# Patient Record
Sex: Female | Born: 1950
Health system: Southern US, Community
[De-identification: ages and names within clinical notes are randomized; demographics above are authoritative.]

## PROBLEM LIST (undated history)

## (undated) DIAGNOSIS — K5900 Constipation, unspecified: Secondary | ICD-10-CM

## (undated) DIAGNOSIS — K649 Unspecified hemorrhoids: Secondary | ICD-10-CM

## (undated) DIAGNOSIS — L509 Urticaria, unspecified: Secondary | ICD-10-CM

## (undated) DIAGNOSIS — K219 Gastro-esophageal reflux disease without esophagitis: Secondary | ICD-10-CM

## (undated) DIAGNOSIS — G43909 Migraine, unspecified, not intractable, without status migrainosus: Secondary | ICD-10-CM

## (undated) HISTORY — PX: TONSILLECTOMY: SUR1361

## (undated) HISTORY — PX: ENDOMETRIAL ABLATION: SHX621

## (undated) HISTORY — PX: ADENOIDECTOMY: SUR15

## (undated) HISTORY — DX: Urticaria, unspecified: L50.9

## (undated) HISTORY — PX: BREAST LUMPECTOMY: SHX2

## (undated) HISTORY — PX: STAPEDES SURGERY: SHX789

---

## 1998-08-10 ENCOUNTER — Other Ambulatory Visit: Admission: RE | Admit: 1998-08-10 | Discharge: 1998-08-10 | Payer: Self-pay | Admitting: Obstetrics and Gynecology

## 2000-03-05 ENCOUNTER — Other Ambulatory Visit: Admission: RE | Admit: 2000-03-05 | Discharge: 2000-03-05 | Payer: Self-pay | Admitting: Obstetrics and Gynecology

## 2001-06-13 ENCOUNTER — Other Ambulatory Visit: Admission: RE | Admit: 2001-06-13 | Discharge: 2001-06-13 | Payer: Self-pay | Admitting: Obstetrics and Gynecology

## 2001-08-05 ENCOUNTER — Other Ambulatory Visit: Admission: RE | Admit: 2001-08-05 | Discharge: 2001-08-05 | Payer: Self-pay | Admitting: Obstetrics and Gynecology

## 2001-11-24 ENCOUNTER — Encounter: Payer: Self-pay | Admitting: Internal Medicine

## 2001-11-24 ENCOUNTER — Ambulatory Visit (HOSPITAL_COMMUNITY): Admission: RE | Admit: 2001-11-24 | Discharge: 2001-11-24 | Payer: Self-pay | Admitting: Internal Medicine

## 2001-12-03 ENCOUNTER — Ambulatory Visit (HOSPITAL_COMMUNITY): Admission: RE | Admit: 2001-12-03 | Discharge: 2001-12-03 | Payer: Self-pay | Admitting: Internal Medicine

## 2001-12-03 ENCOUNTER — Encounter: Payer: Self-pay | Admitting: Internal Medicine

## 2001-12-04 ENCOUNTER — Ambulatory Visit (HOSPITAL_COMMUNITY): Admission: RE | Admit: 2001-12-04 | Discharge: 2001-12-04 | Payer: Self-pay | Admitting: Internal Medicine

## 2001-12-04 ENCOUNTER — Encounter: Payer: Self-pay | Admitting: Internal Medicine

## 2001-12-24 ENCOUNTER — Encounter (HOSPITAL_COMMUNITY): Admission: RE | Admit: 2001-12-24 | Discharge: 2002-01-23 | Payer: Self-pay | Admitting: Orthopaedic Surgery

## 2002-01-23 ENCOUNTER — Encounter (HOSPITAL_COMMUNITY): Admission: RE | Admit: 2002-01-23 | Discharge: 2002-02-22 | Payer: Self-pay | Admitting: Orthopaedic Surgery

## 2002-07-28 ENCOUNTER — Other Ambulatory Visit: Admission: RE | Admit: 2002-07-28 | Discharge: 2002-07-28 | Payer: Self-pay | Admitting: Obstetrics and Gynecology

## 2004-06-14 ENCOUNTER — Other Ambulatory Visit: Admission: RE | Admit: 2004-06-14 | Discharge: 2004-06-14 | Payer: Self-pay | Admitting: Obstetrics and Gynecology

## 2005-08-31 ENCOUNTER — Ambulatory Visit (HOSPITAL_COMMUNITY): Admission: RE | Admit: 2005-08-31 | Discharge: 2005-08-31 | Payer: Self-pay | Admitting: *Deleted

## 2005-08-31 ENCOUNTER — Encounter (INDEPENDENT_AMBULATORY_CARE_PROVIDER_SITE_OTHER): Payer: Self-pay | Admitting: Specialist

## 2006-08-01 ENCOUNTER — Ambulatory Visit: Payer: Self-pay | Admitting: Gastroenterology

## 2006-08-29 ENCOUNTER — Ambulatory Visit: Payer: Self-pay | Admitting: Gastroenterology

## 2006-08-29 ENCOUNTER — Ambulatory Visit (HOSPITAL_COMMUNITY): Admission: RE | Admit: 2006-08-29 | Discharge: 2006-08-29 | Payer: Self-pay | Admitting: Gastroenterology

## 2006-11-13 ENCOUNTER — Ambulatory Visit: Payer: Self-pay | Admitting: Gastroenterology

## 2007-08-13 ENCOUNTER — Ambulatory Visit (HOSPITAL_COMMUNITY): Admission: RE | Admit: 2007-08-13 | Discharge: 2007-08-13 | Payer: Self-pay | Admitting: Family Medicine

## 2008-04-16 ENCOUNTER — Ambulatory Visit: Payer: Self-pay | Admitting: Gastroenterology

## 2009-05-16 ENCOUNTER — Encounter: Payer: Self-pay | Admitting: Gastroenterology

## 2009-06-28 ENCOUNTER — Ambulatory Visit (HOSPITAL_COMMUNITY): Admission: RE | Admit: 2009-06-28 | Discharge: 2009-06-28 | Payer: Self-pay | Admitting: Family Medicine

## 2009-11-30 ENCOUNTER — Encounter: Payer: Self-pay | Admitting: Gastroenterology

## 2009-12-01 ENCOUNTER — Encounter: Payer: Self-pay | Admitting: Gastroenterology

## 2010-04-14 ENCOUNTER — Encounter (INDEPENDENT_AMBULATORY_CARE_PROVIDER_SITE_OTHER): Payer: Self-pay | Admitting: *Deleted

## 2010-05-09 NOTE — Medication Information (Signed)
Summary: Tax adviser   Imported By: Rosine Beat 11/30/2009 17:00:43  _____________________________________________________________________  External Attachment:    Type:   Image     Comment:   External Document  Appended Document: RX Folder - amitiza    Prescriptions: AMITIZA 24 MCG CAPS (LUBIPROSTONE) one by mouth two times a day with food  #60 x 5   Entered and Authorized by:   Leanna Battles. Dixon Boos   Signed by:   Leanna Battles Dixon Boos on 12/01/2009   Method used:   Electronically to        [facility] (retail)       [address]       [place], [place]  [address]       Ph: [phone] or [phone]       Fax: [phone]   RxID:   [id number]    NEEDS OV 04/2010  Appended Document: RX Folder reminder in computer

## 2010-05-09 NOTE — Medication Information (Signed)
Summary: Tax adviser   Imported By: Ricard Dillon 05/16/2009 09:13:48  _____________________________________________________________________  External Attachment:    Type:   Image     Comment:   External Document  Appended Document: RX Folder-aciphex    Prescriptions: ACIPHEX 20 MG TBEC (RABEPRAZOLE SODIUM) one by mouth 30 mins before breakfast daily  #30 x 11   Entered and Authorized by:   Leanna Battles. Dixon Boos   Signed by:   Leanna Battles Dixon Boos on 05/16/2009   Method used:   Electronically to        [facility] (retail)       [address]       [place], [place]  [address]       Ph: [phone] or [phone]       Fax: [phone]   RxID:   [id number]

## 2010-05-09 NOTE — Medication Information (Signed)
Summary: AMITIZA  AMITIZA   Imported By: Rexene Alberts 12/01/2009 09:25:33  _____________________________________________________________________  External Attachment:    Type:   Image     Comment:   External Document  Appended Document: AMITIZA duplicate

## 2010-05-11 NOTE — Letter (Signed)
Summary: Recall Office Visit  [facility] [facility]  [address]   [place], [place] [address]   Phone: [phone]  Fax: [phone]      April 14, 2010   [facility] [address] [place], [place]  [address] [birth year]   Dear Ms. Trickett,   According to our records, it is time for you to schedule a follow-up office visit with [place].   At your convenience, please call [phone] to schedule an office visit. If you have any questions, concerns, or feel that this letter is in error, we would appreciate your call.   Sincerely,    Diana Eves  [facility] Ph: [phone]   Fax: [phone]

## 2010-08-22 NOTE — Assessment & Plan Note (Signed)
NAMEALEC, MCPHEE               CHART#:  [id number]   DATE:  11/13/2006                       DOB:  Jun 27, 1950   REERRING PHYSICIAN:  Elfredia Nevins, M.D.   DATE OF VISIT:  November 13, 2006   PROBLEM LIST:  1. Rectal bleeding, secondary to external hemorrhoids.  2. Gastroesophageal reflux disease.  3. Migraines.  4. Endometrial ablation.  5. Lumpectomy in right breast.   SUBJECTIVE:  Ms. Heaps is a 60 year old female, who presents as a  return patient visit.  She is only seeing rectal bleeding once a week.  She reports that sometimes it is a lot and sometimes it is not much.  She does not use the suppositories much.  She rarely has abdominal pain.  She is using the Amitiza twice a day, but has not noticed a dramatic  improvement in her constipation.  She is trying to eat fiber, such as  fruit and bran cereal.  She usually bleeds when she has a hard bowel  movement, but not always.  She has loose stools one to two times a week.   MEDICATIONS:  1. Imitrex as needed.  2. Zyrtec over-the-counter daily.  3. Aciphex daily.  4. Amitiza 24 micrograms twice daily.   OBJECTIVE:  VITAL SIGNS:  Weight 152 pounds, height 5 feet 3 inches,  temperature 97.6, blood pressure 120/82, pulse 72.  GENERAL:  She is in no apparent distress, alert and oriented times  four.LUNGS:  Her lungs are clear to auscultation bilaterally.  CARDIOVASCULAR EXAM:  Regular rhythm, no murmur, normal S1 and  S2.ABDOMEN:  Bowel sounds are present, soft, nontender, nondistended.  No rebound or guarding.EXTREMITIES:  Without cyanosis, clubbing or  edema.   ASSESSMENT:  Ms. Twiggs is a 60 year old female, who has rectal bleeding  secondary to hemorrhoids.  She has constipation, which is likely  secondary to a functional gut disorder.  Her gastroesophageal reflux  disease is well-controlled.   Thank you for allowing me to see Ms. Vandergrift in consultation.  My  recommendations follow.   RECOMMENDATIONS:  1. She  should continue the Amitiza.  She is asked to insure that she      drinks six to eight cups of water or juice daily.  2. She is to achieve 20-30 g of fiber daily.  She was given a high-      fiber-diet hand-out.  3. She has a return patient visit to see me in four months.       Kassie Mends, M.D.  Electronically Signed     SM/MEDQ  D:  11/14/2006  T:  11/15/2006  Job:  [id number]   cc:   Madelin Rear. Sherwood Gambler, MD

## 2010-08-22 NOTE — Assessment & Plan Note (Signed)
[name], [name]               CHART#:  [id number]   DATE:  04/16/2008                       DOB:  21-Sep-1950   REFERRING Navil Kole:  Madelin Rear. Sherwood Gambler, MD   PROBLEM LIST:  1. Intermittent rectal bleeding secondary to external hemorrhoids.  2. Constipation.  3. GERD.  4. Migraines.  5. Endometrial ablation.  6. Right breast lumpectomy.   SUBJECTIVE:  [name] is a 60 year old female who presents as a return  patient visit.  She was last seen in August 2008.  She is doing fairly  well.  Her gastroesophageal reflux disease is well controlled on  Aciphex.  She continues to have intermittent problems with constipation.  She occasionally sees rectal bleeding when she has hard stool.  She has  been drinking water and eating more fruit and wheat.  She has been more  regular since she started drinking a solution for her joints.  Her last  colonoscopy was in 2008.   ALLERGIES:  Codeine.   MEDICATIONS:  1. Zyrtec over-the-counter.  2. Aciphex 20 mg daily.  3. Amitiza 24 mcg b.i.d.  4. Mucinex-D.   OBJECTIVE:  VITAL SIGNS:  Weight 152 pounds, height 5 feet 3 inches,  temperature 97.6, blood pressure 102/60, and pulse 72.  GENERAL:  She is in no apparent distress, alert and oriented x4.LUNGS:  Clear to auscultation bilaterally.CARDIOVASCULAR:  Regular rhythm.  No  murmur.ABDOMEN:  Bowel sounds are present, soft,  nondistended.EXTREMITIES:  No cyanosis or edema.   ASSESSMENT:  [name] is a 60 year old female whose gastroesophageal  reflux disease is well controlled.  She has chronic constipation.  Her  rectal bleeding is secondary to hemorrhoids. Thank you for allowing me  to see [name] in consultation.  My recommendations follow.   RECOMMENDATIONS:  1. Refilled her Amitiza and Aciphex for up to a year.  She is given a      prescription for Anusol rectal suppository and is asked to use them      every 12 hours for 14 days when she has hemorrhoidal flares.  She      was  given 2 refills.  2. She may follow up in 24 months.  Will continue to refill her      Aciphex until her next visit in 24 months.       Kassie Mends, M.D.  Electronically Signed     SM/MEDQ  D:  04/16/2008  T:  04/16/2008  Job:  [id number]   cc:   Madelin Rear. Sherwood Gambler, MD

## 2010-08-25 NOTE — Consult Note (Signed)
NAMEBERRY, GALLACHER              ACCOUNT NO.:  [id number]   MEDICAL RECORD NO.:  [id number]          PATIENT TYPE:  AMB   LOCATION:  DAY                           FACILITY:  [facility]   PHYSICIAN:  Kassie Mends, M.D.      DATE OF BIRTH:  01-Jan-1951   DATE OF CONSULTATION:  08/01/2006  DATE OF DISCHARGE:                                 CONSULTATION   REASON FOR CONSULTATION:  Rectal bleeding and heartburn.   HISTORY OF PRESENT ILLNESS:  [name] is a 60 year old female who  reports having rectal bleeding for many years.  She reports having at  least one bowel movement a day.  She occasionally has abdominal pain.  She is having problems swallowing.  She reports having heartburn all  all the time.  She usually uses Zantac.  She wants to try AcipHex.  She tried her husband's and it worked for her.  She occasionally has  intermittent diarrhea with constipation.  She has a daughter who has IBS  with constipation and a son who has IBS with diarrhea.  She denies any  black stool with tar.  She denies any weight loss or change in her bowel  habits.   PAST MEDICAL HISTORY:  1. Back trouble  2. Migraines.   PAST SURGICAL HISTORY:  1. Tonsillectomy.  2. Lumpectomy in the right breast.  3. Endometrial ablation.   ALLERGIES:  CODEINE makes her nauseated.   MEDICATIONS:  1. Imitrex as needed.  2. Zyrtec over-the-counter.  3. Zantac.   FAMILY HISTORY:  Her mother had polyps over the age of 64.  She has  family history of diabetes, asthma and thyroid disease.   SOCIAL HISTORY:  She is married for 32 years and has two children.  She  is employed.  She drinks less than once every 3 months.  She only drinks  a glass of wine.   REVIEW OF SYSTEMS:  She has never had a colonoscopy.  Review of systems  per the HPI. Otherwise all systems are negative.   PHYSICAL EXAMINATION:  VITAL SIGNS:  Weight 152 pounds, height 5 feet 3  inches, BMI 26.9 (overweight) Temperature 97.5, blood pressure  122/72,  pulse 66.  GENERAL:  She is in no apparent distress, alert and oriented  x4.  HEENT:  Exam is atraumatic, normocephalic.  Pupils equal and react to  light.  Mouth: No oral lesions.  Posterior pharynx without erythema or  exudate.  NECK:  Full range of motion.  No lymphadenopathy.  LUNGS:  Clear to auscultation bilaterally.  CARDIOVASCULAR:  Regular rhythm, no murmur, normal S1 and S2.  ABDOMEN:  Bowel sounds are present. Soft, nontender, nondistended.  No  rebound or guarding, no hepatosplenomegaly, abdominal bruits or  pulsatile masses.  EXTREMITIES:  Without cyanosis, clubbing or edema.  NEURO:  No focal neurologic deficits.   LABORATORY:  [id number] - white count 5.7, hemoglobin 14.1, platelets  297, potassium 4.1, total bili 0.5 and alk phos 48, AST 13, ALT 9,  albumin 4.5, calcium 9.5, triglycerides 127.   ASSESSMENT:  [name] is a 60 year old female with rectal  bleeding  which is likely secondary to hemorrhoids but the differential diagnosis  includes colorectal polyp or malignancy.  She also has gastroesophageal  reflux disease which is not ideally controlled with an H2 blocker.  Thank you for allowing me to see [name] in consultation.  My  recommendations follow.   RECOMMENDATIONS:  1. She is not interested in a liquid bowel prep for colonoscopy.  I      discussed the risks of kidney damage with the osmo prep.  She is      instructed to drink plenty of fluids with the pills and to make      sure her urine stays yellow.  The colonoscopy will be performed for      rectal bleeding and screening.  She will be scheduled on May 22.  2. She is given a prescription for AcipHex as well as the prescription      reimbursement card and reimbursement paper work.  She is also given      the [facility] gastroesophageal reflux disease handout.  On the      day of her colonoscopy we can also discuss whether or not she is      interested in surveillance for Barrett's.  The  likelihood of the      evidence suggests that Barrett's surveillance should usually be      performed on white males over the age of 33.  3. We will obtain records from [facility] from her endometrial      ablation and will review records from [facility] from her      lumpectomy because Ms. Boys has concerns that sedation will cause      her to be extremely nauseated and vomit.  4. She will be scheduled for a follow-up visit with me after endoscopy      is complete.      Kassie Mends, M.D.  Electronically Signed     SM/MEDQ  D:  08/01/2006  T:  08/02/2006  Job:  [id number]   cc:   Madelin Rear. Sherwood Gambler, MD  Fax: [phone]

## 2010-08-25 NOTE — Op Note (Signed)
[name], [name]              ACCOUNT NO.:  [id number]   MEDICAL RECORD NO.:  [id number]          PATIENT TYPE:  AMB   LOCATION:  SDC                           FACILITY:  [facility]   PHYSICIAN:  Miguel Aschoff, M.D.       DATE OF BIRTH:  [birth year]   DATE OF PROCEDURE:  08/31/2005  DATE OF DISCHARGE:                                 OPERATIVE REPORT   PREOPERATIVE DIAGNOSIS:  Irregular vaginal bleeding.   POSTOPERATIVE DIAGNOSIS:  Endometrial polyps.   PROCEDURE:  Cervical dilatation, hysteroscopy, removal of endometrial  polyps, uterine curettage, NovaSure ablation of endometrium.   SURGEON:  Miguel Aschoff, M.D.   ANESTHESIA:  IV sedation with paracervical block.   COMPLICATIONS:  None.   JUSTIFICATION:  The patient is a 60 year old white female with a history of  irregular vaginal bleeding who presents now to undergo evaluation with  hysteroscopy and D&C as well as to try arrest her bleeding using endometrial  ablation.  The risks and benefits of the procedure were discussed with the  patient and informed consent has been obtained.   PROCEDURE:  The patient was taken to the operating and placed in the supine  position and IV sedation was administered without difficulty.  She was then  placed in dorsal lithotomy position and prepped and draped in the usual  sterile fashion.  Examination was carried out which revealed normal external  genitalia, normal Bartholin and Skene's glands, normal urethra.  The vaginal  vault was without gross lesion.  The cervix was without gross lesion.  The  uterus was noted to be globular, top normal size, and retroflexed, no  adnexal masses were noted.  At this point, the uterine cavity was sounded to  11 cm.  A cervical length of 4.5 cm was then determined.  Then, the  endocervical canal was further dilated using serial Pratt dilators until a  #25 Pratt dilator could be passed. At this point, the diagnostic  hysteroscope was advanced through the cervix.   No endocervical lesions were  noted.  Upon entering the endometrial cavity, it was the patient had  multiple endometrial polyps. After desiccating the pathology within the  cavity, the hysteroscope was removed and polyp forceps were introduced and  all the polyps were removed without difficulty and sent for histologic  study.  The hysteroscope was then replaced and again, it was clear the  polyps had been removed. At this point, sharp curettage was carried out of  the endometrial cavity.  Curettage was completed and the specimen was  obtained for histologic study.  The NovaSure endometrial ablation unit was  advanced through the cervix set with a cavity width of 5 cm.  At this point,  the treatment cycle at 179 watts for 69 seconds was carried out without  difficulty.  After completion of the treatment cycle, the unit was removed  intact.  There was hemostasis readily achieved.  It should be noted, a  paracervical block using 18 mL of 1% Xylocaine had been placed prior to the  endometrial ablation with 6 mL being placed at the 12, 4,  and 8 o'clock  positions.  At this point, with the procedure being completed, the patient  was taken out of the lithotomy position and brought to the recovery room in  satisfactory condition.  The estimated blood loss was approximately 20 mL.  The patient tolerated the procedure well.   PLAN:  The patient is to be sent home. Medications for home include Darvocet  N100 one every 4 hours as needed for pain.  She is to resume all her other  outpatient medications.  She is to call for pathology report on the 30th.  She is to call for any problems such as fever, pain or heavy bleeding.      Miguel Aschoff, M.D.  Electronically Signed     AR/MEDQ  D:  08/31/2005  T:  09/01/2005  Job:  [id number]

## 2010-08-25 NOTE — Op Note (Signed)
NAMEAMIA, [name]              ACCOUNT NO.:  [id number]   MEDICAL RECORD NO.:  [id number]          PATIENT TYPE:  AMB   LOCATION:  DAY                           FACILITY:  [facility]   PHYSICIAN:  Kassie Mends, M.D.      DATE OF BIRTH:  [birth year]   DATE OF PROCEDURE:  08/29/2006  DATE OF DISCHARGE:                               OPERATIVE REPORT   PROCEDURE:  Colonoscopy.   INDICATION FOR EXAM:  [name] is a 60 year old female with rectal  bleeding for many years.  She presents for colon cancer screening.  Her  mother had polyps after the age of 10.   FINDINGS:  External hemorrhoids: Otherwise normal colon without evidence  of polyps, masses, inflammatory changes, diverticula, AVMs or internal  hemorrhoids.   RECOMMENDATIONS:  1. High fiber diet:  She is given handout on high-fiber diet and      hemorrhoids.  2. Screening colonoscopy in 10 years since her first degree relative      was diagnosed with polyps after the age of 59.  3. She may use Colace 100 mg once or twice daily to soften stool.  4. I will give her a prescription for Anusol HC one per rectum twice      daily for 14 days if her rectal bleeding occurs.  5. Follow-up appointment in 48-months with Dr. Cira Servant.   MEDICATIONS:  1. Demerol 75 mg IV.  2. Versed 5 mg IV.   PROCEDURE TECHNIQUE:  Physical exam was performed.  Informed consent was  obtained from the patient after the explaining benefits, risks and  alternatives of the procedure.  The patient was connected to the monitor  and placed in the left lateral position.  Continuous oxygen was provided  by nasal cannula and IV medicine administered through an indwelling  cannula.  After administration of sedation and rectal exam, the  patient's rectum was intubated  and the scope was advanced under direct visualization to the cecum.  The  scope was subsequently removed slowly by carefully examining the color,  texture, anatomy and integrity of the mucosa on the way  out.  The  patient was recovered in endoscopy and discharged home in satisfactory  condition.      Kassie Mends, M.D.  Electronically Signed     SM/MEDQ  D:  08/29/2006  T:  08/29/2006  Job:  [id number]   cc:   Madelin Rear. Sherwood Gambler, MD  Fax: [phone]

## 2011-02-12 ENCOUNTER — Other Ambulatory Visit (HOSPITAL_COMMUNITY): Payer: Self-pay | Admitting: Family Medicine

## 2011-02-12 ENCOUNTER — Ambulatory Visit (HOSPITAL_COMMUNITY)
Admission: RE | Admit: 2011-02-12 | Discharge: 2011-02-12 | Disposition: A | Discharge status: Home / Self Care | Payer: 59 | Source: Ambulatory Visit | Attending: Family Medicine | Admitting: Family Medicine

## 2011-02-12 DIAGNOSIS — M25579 Pain in unspecified ankle and joints of unspecified foot: Secondary | ICD-10-CM | POA: Insufficient documentation

## 2013-01-23 ENCOUNTER — Encounter (HOSPITAL_COMMUNITY): Payer: Self-pay | Admitting: Emergency Medicine

## 2013-01-23 ENCOUNTER — Emergency Department (HOSPITAL_COMMUNITY): Payer: 59

## 2013-01-23 ENCOUNTER — Emergency Department (HOSPITAL_COMMUNITY)
Admission: EM | Admit: 2013-01-23 | Discharge: 2013-01-23 | Disposition: A | Discharge status: Home / Self Care | Payer: 59 | Attending: Emergency Medicine | Admitting: Emergency Medicine

## 2013-01-23 DIAGNOSIS — Z8679 Personal history of other diseases of the circulatory system: Secondary | ICD-10-CM | POA: Insufficient documentation

## 2013-01-23 DIAGNOSIS — K219 Gastro-esophageal reflux disease without esophagitis: Secondary | ICD-10-CM | POA: Insufficient documentation

## 2013-01-23 DIAGNOSIS — Y9389 Activity, other specified: Secondary | ICD-10-CM | POA: Insufficient documentation

## 2013-01-23 DIAGNOSIS — W010XXA Fall on same level from slipping, tripping and stumbling without subsequent striking against object, initial encounter: Secondary | ICD-10-CM | POA: Insufficient documentation

## 2013-01-23 DIAGNOSIS — S5291XA Unspecified fracture of right forearm, initial encounter for closed fracture: Secondary | ICD-10-CM

## 2013-01-23 DIAGNOSIS — S52599A Other fractures of lower end of unspecified radius, initial encounter for closed fracture: Secondary | ICD-10-CM | POA: Insufficient documentation

## 2013-01-23 DIAGNOSIS — Y929 Unspecified place or not applicable: Secondary | ICD-10-CM | POA: Insufficient documentation

## 2013-01-23 DIAGNOSIS — Z79899 Other long term (current) drug therapy: Secondary | ICD-10-CM | POA: Insufficient documentation

## 2013-01-23 HISTORY — DX: Constipation, unspecified: K59.00

## 2013-01-23 HISTORY — DX: Gastro-esophageal reflux disease without esophagitis: K21.9

## 2013-01-23 HISTORY — DX: Unspecified hemorrhoids: K64.9

## 2013-01-23 HISTORY — DX: Migraine, unspecified, not intractable, without status migrainosus: G43.909

## 2013-01-23 NOTE — ED Notes (Addendum)
Pain rt wrist, fell onto outstretched arm.[name] radial pulse  Ice pack to wrist.

## 2013-01-23 NOTE — ED Provider Notes (Signed)
  Medical screening examination/treatment/procedure(s) were performed by non-physician practitioner and as supervising physician I was immediately available for consultation/collaboration.   Joshuan Bolander, MD 01/23/13 2224 

## 2013-01-23 NOTE — ED Provider Notes (Signed)
CSN: [id number]     Arrival date & time 01/23/13  1937 History   First MD Initiated Contact with Patient 01/23/13 2010     Chief Complaint  Patient presents with  . Wrist Pain   (Consider location/radiation/quality/duration/timing/severity/associated sxs/prior Treatment) HPI Comments: [name] is a 62 y.o. Right handed female presenting with right wrist pain after tripping and falling on her outstretched right hand prior to arrival.  She was standing along the edge of a local creek, and landed in soft soil.  Her pain is constant and worse with palpation and attempts at movement.  She is able to bend her fingers without difficulty, denies numbness in her hand and has no pain in her elbow or shoulder with movement.  Pain in her wrist does radiate to his proximal forearm.  She has taken no medicines prior to arrival. She denies other injury including no head, neck or back injury or pain.     The history is provided by the patient.    Past Medical History  Diagnosis Date  . GERD (gastroesophageal reflux disease)   . Migraine headache   . Hemorrhoids   . Constipation    Past Surgical History  Procedure Laterality Date  . Endometrial ablation    . Breast lumpectomy Right   . Tonsillectomy     History reviewed. No pertinent family history. History  Substance Use Topics  . Smoking status: Never Smoker   . Smokeless tobacco: Not on file  . Alcohol Use: Yes   OB History   Grav Para Term Preterm Abortions TAB SAB Ect Mult Living                 Review of Systems  Constitutional: Negative for fever.  Musculoskeletal: Positive for arthralgias and joint swelling. Negative for myalgias.  Neurological: Negative for weakness and numbness.    Allergies  Codeine  Home Medications   Current Outpatient Rx  Name  Route  Sig  Dispense  Refill  . acetaminophen (TYLENOL) 325 MG tablet   Oral   Take 650 mg by mouth every 6 (six) hours as needed for pain.         Marland Kitchen BIOTIN  PO   Oral   Take 1 tablet by mouth daily.         . calcium carbonate (OS-CAL) 600 MG TABS tablet   Oral   Take 600 mg by mouth 2 (two) times daily with a meal.         . diphenhydrAMINE (BENADRYL) 25 MG tablet   Oral   Take 50 mg by mouth every 6 (six) hours as needed for itching.         . RABEprazole (ACIPHEX) 20 MG tablet   Oral   Take 20 mg by mouth daily.          BP 113/75  Pulse 73  Temp(Src) 98.4 F (36.9 C) (Oral)  Ht 5' 3.5" (1.613 m)  Wt 150 lb (68.04 kg)  BMI 26.15 kg/m2  SpO2 99% Physical Exam  Constitutional: She appears well-developed and well-nourished.  HENT:  Head: Atraumatic.  Neck: Normal range of motion.  Cardiovascular:  Pulses equal bilaterally  Musculoskeletal: She exhibits tenderness.       Right wrist: She exhibits decreased range of motion, tenderness, bony tenderness and swelling. She exhibits no deformity and no laceration.  ttp at right distal radius.  Skin intact.  No numbness distal to the injury site.  Pt has less than 3 sec  cap refill.  Elbow and shoulder (right) nontender.  Neurological: She is alert. She has normal strength. She displays normal reflexes. No sensory deficit.  Equal strength  Skin: Skin is warm and dry.  Psychiatric: She has a normal mood and affect.    ED Course  Procedures (including critical care time)    sugar tong splint applied.  Sling.     Labs Review Labs Reviewed - No data to display Imaging Review Dg Wrist Complete Right  01/23/2013   CLINICAL DATA:  Fall, wrist pain.  EXAM: RIGHT WRIST - COMPLETE 3+ VIEW  COMPARISON:  None.  FINDINGS: There is a mildly comminuted fracture within the distal right radius. Slight posterior angulation of the distal fragment and displacement of fracture fragments. No ulnar abnormality. Diffuse soft tissue swelling.  IMPRESSION: Comminuted, slightly angulated distal right radial fracture.   Electronically Signed   By: Charlett Nose M.D.   On: 01/23/2013 20:24     EKG Interpretation   None       MDM   1. Radial fracture, right, closed, initial encounter    Sling,  Splint,  RICE,  Pt to call her orthopedist Dr. Lajoyce Corners for an appt  Mon for recheck/cast placement.  Pt understands plans.  Discussed pain med.  States has hydrocodone from prior ortho injury - will use this prn.  Patients labs and/or radiological studies were viewed and considered during the medical decision making and disposition process.     Burgess Amor, PA-C 01/23/13 2145

## 2015-01-23 ENCOUNTER — Emergency Department (HOSPITAL_COMMUNITY)
Admission: EM | Admit: 2015-01-23 | Discharge: 2015-01-23 | Disposition: A | Discharge status: Home / Self Care | Payer: 59 | Source: Home / Self Care | Attending: Emergency Medicine | Admitting: Emergency Medicine

## 2015-01-23 ENCOUNTER — Encounter (HOSPITAL_COMMUNITY): Payer: Self-pay | Admitting: Emergency Medicine

## 2015-01-23 DIAGNOSIS — T148 Other injury of unspecified body region: Secondary | ICD-10-CM

## 2015-01-23 DIAGNOSIS — S39012A Strain of muscle, fascia and tendon of lower back, initial encounter: Secondary | ICD-10-CM | POA: Diagnosis not present

## 2015-01-23 DIAGNOSIS — T148XXA Other injury of unspecified body region, initial encounter: Secondary | ICD-10-CM

## 2015-01-23 MED ORDER — TRAMADOL HCL 50 MG PO TABS: 50.0000 mg | ORAL_TABLET | Freq: Four times a day (QID) | ORAL | Status: DC | PRN

## 2015-01-23 MED ORDER — MELOXICAM 15 MG PO TABS: 15.0000 mg | ORAL_TABLET | Freq: Every day | ORAL | Status: DC

## 2015-01-23 MED ORDER — DICLOFENAC SODIUM 1 % TD GEL: 1.0000 "application " | Freq: Four times a day (QID) | TRANSDERMAL | Status: DC

## 2015-01-23 NOTE — Discharge Instructions (Signed)
Low Back Strain With Rehab A strain is an injury in which a tendon or muscle is torn. The muscles and tendons of the lower back are vulnerable to strains. However, these muscles and tendons are very strong and require a great force to be injured. Strains are classified into three categories. Grade 1 strains cause pain, but the tendon is not lengthened. Grade 2 strains include a lengthened ligament, due to the ligament being stretched or partially ruptured. With grade 2 strains there is still function, although the function may be decreased. Grade 3 strains involve a complete tear of the tendon or muscle, and function is usually impaired. SYMPTOMS   Pain in the lower back.  Pain that affects one side more than the other.  Pain that gets worse with movement and may be felt in the hip, buttocks, or back of the thigh.  Muscle spasms of the muscles in the back.  Swelling along the muscles of the back.  Loss of strength of the back muscles.  Crackling sound (crepitation) when the muscles are touched. CAUSES  Lower back strains occur when a force is placed on the muscles or tendons that is greater than they can handle. Common causes of injury include:  Prolonged overuse of the muscle-tendon units in the lower back, usually from incorrect posture.  A single violent injury or force applied to the back. RISK INCREASES WITH:  Sports that involve twisting forces on the spine or a lot of bending at the waist (football, rugby, weightlifting, bowling, golf, tennis, speed skating, racquetball, swimming, running, gymnastics, diving).  Poor strength and flexibility.  Failure to warm up properly before activity.  Family history of lower back pain or disk disorders.  Previous back injury or surgery (especially fusion).  Poor posture with lifting, especially heavy objects.  Prolonged sitting, especially with poor posture. PREVENTION   Learn and use proper posture when sitting or lifting (maintain  proper posture when sitting, lift using the knees and legs, not at the waist).  Warm up and stretch properly before activity.  Allow for adequate recovery between workouts.  Maintain physical fitness:  Strength, flexibility, and endurance.  Cardiovascular fitness. PROGNOSIS  If treated properly, lower back strains usually heal within 6 weeks. RELATED COMPLICATIONS   Recurring symptoms, resulting in a chronic problem.  Chronic inflammation, scarring, and partial muscle-tendon tear.  Delayed healing or resolution of symptoms.  Prolonged disability. TREATMENT  Treatment first involves the use of ice and medicine, to reduce pain and inflammation. The use of strengthening and stretching exercises may help reduce pain with activity. These exercises may be performed at home or with a therapist. Severe injuries may require referral to a therapist for further evaluation and treatment, such as ultrasound. Your caregiver may advise that you wear a back brace or corset, to help reduce pain and discomfort. Often, prolonged bed rest results in greater harm then benefit. Corticosteroid injections may be recommended. However, these should be reserved for the most serious cases. It is important to avoid using your back when lifting objects. At night, sleep on your back on a firm mattress with a pillow placed under your knees. If non-surgical treatment is unsuccessful, surgery may be needed.  MEDICATION   If pain medicine is needed, nonsteroidal anti-inflammatory medicines (aspirin and ibuprofen), or other minor pain relievers (acetaminophen), are often advised.  Do not take pain medicine for 7 days before surgery.  Prescription pain relievers may be given, if your caregiver thinks they are needed. Use only as  directed and only as much as you need.  Ointments applied to the skin may be helpful.  Corticosteroid injections may be given by your caregiver. These injections should be reserved for the most  serious cases, because they may only be given a certain number of times. HEAT AND COLD  Cold treatment (icing) should be applied for 10 to 15 minutes every 2 to 3 hours for inflammation and pain, and immediately after activity that aggravates your symptoms. Use ice packs or an ice massage.  Heat treatment may be used before performing stretching and strengthening activities prescribed by your caregiver, physical therapist, or athletic trainer. Use a heat pack or a warm water soak. SEEK MEDICAL CARE IF:   Symptoms get worse or do not improve in 2 to 4 weeks, despite treatment.  You develop numbness, weakness, or loss of bowel or bladder function.  New, unexplained symptoms develop. (Drugs used in treatment may produce side effects.) EXERCISES  RANGE OF MOTION (ROM) AND STRETCHING EXERCISES - Low Back Strain Most people with lower back pain will find that their symptoms get worse with excessive bending forward (flexion) or arching at the lower back (extension). The exercises which will help resolve your symptoms will focus on the opposite motion.  Your physician, physical therapist or athletic trainer will help you determine which exercises will be most helpful to resolve your lower back pain. Do not complete any exercises without first consulting with your caregiver. Discontinue any exercises which make your symptoms worse until you speak to your caregiver.  If you have pain, numbness or tingling which travels down into your buttocks, leg or foot, the goal of the therapy is for these symptoms to move closer to your back and eventually resolve. Sometimes, these leg symptoms will get better, but your lower back pain may worsen. This is typically an indication of progress in your rehabilitation. Be very alert to any changes in your symptoms and the activities in which you participated in the 24 hours prior to the change. Sharing this information with your caregiver will allow him/her to most efficiently  treat your condition.  These exercises may help you when beginning to rehabilitate your injury. Your symptoms may resolve with or without further involvement from your physician, physical therapist or athletic trainer. While completing these exercises, remember:  Restoring tissue flexibility helps normal motion to return to the joints. This allows healthier, less painful movement and activity.  An effective stretch should be held for at least 30 seconds.  A stretch should never be painful. You should only feel a gentle lengthening or release in the stretched tissue. FLEXION RANGE OF MOTION AND STRETCHING EXERCISES: STRETCH - Flexion, Single Knee to Chest   Lie on a firm bed or floor with both legs extended in front of you.  Keeping one leg in contact with the floor, bring your opposite knee to your chest. Hold your leg in place by either grabbing behind your thigh or at your knee.  Pull until you feel a gentle stretch in your lower back. Hold __________ seconds.  Slowly release your grasp and repeat the exercise with the opposite side. Repeat __________ times. Complete this exercise __________ times per day.  STRETCH - Flexion, Double Knee to Chest   Lie on a firm bed or floor with both legs extended in front of you.  Keeping one leg in contact with the floor, bring your opposite knee to your chest.  Tense your stomach muscles to support your back and then   lift your other knee to your chest. Hold your legs in place by either grabbing behind your thighs or at your knees.  Pull both knees toward your chest until you feel a gentle stretch in your lower back. Hold __________ seconds.  Tense your stomach muscles and slowly return one leg at a time to the floor. Repeat __________ times. Complete this exercise __________ times per day.  STRETCH - Low Trunk Rotation  Lie on a firm bed or floor. Keeping your legs in front of you, bend your knees so they are both pointed toward the ceiling  and your feet are flat on the floor.  Extend your arms out to the side. This will stabilize your upper body by keeping your shoulders in contact with the floor.  Gently and slowly drop both knees together to one side until you feel a gentle stretch in your lower back. Hold for __________ seconds.  Tense your stomach muscles to support your lower back as you bring your knees back to the starting position. Repeat the exercise to the other side. Repeat __________ times. Complete this exercise __________ times per day  EXTENSION RANGE OF MOTION AND FLEXIBILITY EXERCISES: STRETCH - Extension, Prone on Elbows   Lie on your stomach on the floor, a bed will be too soft. Place your palms about shoulder width apart and at the height of your head.  Place your elbows under your shoulders. If this is too painful, stack pillows under your chest.  Allow your body to relax so that your hips drop lower and make contact more completely with the floor.  Hold this position for __________ seconds.  Slowly return to lying flat on the floor. Repeat __________ times. Complete this exercise __________ times per day.  RANGE OF MOTION - Extension, Prone Press Ups  Lie on your stomach on the floor, a bed will be too soft. Place your palms about shoulder width apart and at the height of your head.  Keeping your back as relaxed as possible, slowly straighten your elbows while keeping your hips on the floor. You may adjust the placement of your hands to maximize your comfort. As you gain motion, your hands will come more underneath your shoulders.  Hold this position __________ seconds.  Slowly return to lying flat on the floor. Repeat __________ times. Complete this exercise __________ times per day.  RANGE OF MOTION- Quadruped, Neutral Spine   Assume a hands and knees position on a firm surface. Keep your hands under your shoulders and your knees under your hips. You may place padding under your knees for  comfort.  Drop your head and point your tail bone toward the ground below you. This will round out your lower back like an angry cat. Hold this position for __________ seconds.  Slowly lift your head and release your tail bone so that your back sags into a large arch, like an old horse.  Hold this position for __________ seconds.  Repeat this until you feel limber in your lower back.  Now, find your "sweet spot." This will be the most comfortable position somewhere between the two previous positions. This is your neutral spine. Once you have found this position, tense your stomach muscles to support your lower back.  Hold this position for __________ seconds. Repeat __________ times. Complete this exercise __________ times per day.  STRENGTHENING EXERCISES - Low Back Strain These exercises may help you when beginning to rehabilitate your injury. These exercises should be done near your "sweet   spot." This is the neutral, low-back arch, somewhere between fully rounded and fully arched, that is your least painful position. When performed in this safe range of motion, these exercises can be used for people who have either a flexion or extension based injury. These exercises may resolve your symptoms with or without further involvement from your physician, physical therapist or athletic trainer. While completing these exercises, remember:  °· Muscles can gain both the endurance and the strength needed for everyday activities through controlled exercises. °· Complete these exercises as instructed by your physician, physical therapist or athletic trainer. Increase the resistance and repetitions only as guided. °· You may experience muscle soreness or fatigue, but the pain or discomfort you are trying to eliminate should never worsen during these exercises. If this pain does worsen, stop and make certain you are following the directions exactly. If the pain is still present after adjustments, discontinue the  exercise until you can discuss the trouble with your caregiver. °STRENGTHENING - Deep Abdominals, Pelvic Tilt °· Lie on a firm bed or floor. Keeping your legs in front of you, bend your knees so they are both pointed toward the ceiling and your feet are flat on the floor. °· Tense your lower abdominal muscles to press your lower back into the floor. This motion will rotate your pelvis so that your tail bone is scooping upwards rather than pointing at your feet or into the floor. °· With a gentle tension and even breathing, hold this position for __________ seconds. °Repeat __________ times. Complete this exercise __________ times per day.  °STRENGTHENING - Abdominals, Crunches  °· Lie on a firm bed or floor. Keeping your legs in front of you, bend your knees so they are both pointed toward the ceiling and your feet are flat on the floor. Cross your arms over your chest. °· Slightly tip your chin down without bending your neck. °· Tense your abdominals and slowly lift your trunk high enough to just clear your shoulder blades. Lifting higher can put excessive stress on the lower back and does not further strengthen your abdominal muscles. °· Control your return to the starting position. °Repeat __________ times. Complete this exercise __________ times per day.  °STRENGTHENING - Quadruped, Opposite UE/LE Lift  °· Assume a hands and knees position on a firm surface. Keep your hands under your shoulders and your knees under your hips. You may place padding under your knees for comfort. °· Find your neutral spine and gently tense your abdominal muscles so that you can maintain this position. Your shoulders and hips should form a rectangle that is parallel with the floor and is not twisted. °· Keeping your trunk steady, lift your right hand no higher than your shoulder and then your left leg no higher than your hip. Make sure you are not holding your breath. Hold this position __________ seconds. °· Continuing to keep your  abdominal muscles tense and your back steady, slowly return to your starting position. Repeat with the opposite arm and leg. °Repeat __________ times. Complete this exercise __________ times per day.  °STRENGTHENING - Lower Abdominals, Double Knee Lift °· Lie on a firm bed or floor. Keeping your legs in front of you, bend your knees so they are both pointed toward the ceiling and your feet are flat on the floor. °· Tense your abdominal muscles to brace your lower back and slowly lift both of your knees until they come over your hips. Be certain not to hold your breath. °·   Hold __________ seconds. Using your abdominal muscles, return to the starting position in a slow and controlled manner. Repeat __________ times. Complete this exercise __________ times per day.  POSTURE AND BODY MECHANICS CONSIDERATIONS - Low Back Strain Keeping correct posture when sitting, standing or completing your activities will reduce the stress put on different body tissues, allowing injured tissues a chance to heal and limiting painful experiences. The following are general guidelines for improved posture. Your physician or physical therapist will provide you with any instructions specific to your needs. While reading these guidelines, remember:  The exercises prescribed by your provider will help you have the flexibility and strength to maintain correct postures.  The correct posture provides the best environment for your joints to work. All of your joints have less wear and tear when properly supported by a spine with good posture. This means you will experience a healthier, less painful body.  Correct posture must be practiced with all of your activities, especially prolonged sitting and standing. Correct posture is as important when doing repetitive low-stress activities (typing) as it is when doing a single heavy-load activity (lifting). RESTING POSITIONS Consider which positions are most painful for you when choosing a  resting position. If you have pain with flexion-based activities (sitting, bending, stooping, squatting), choose a position that allows you to rest in a less flexed posture. You would want to avoid curling into a fetal position on your side. If your pain worsens with extension-based activities (prolonged standing, working overhead), avoid resting in an extended position such as sleeping on your stomach. Most people will find more comfort when they rest with their spine in a more neutral position, neither too rounded nor too arched. Lying on a non-sagging bed on your side with a pillow between your knees, or on your back with a pillow under your knees will often provide some relief. Keep in mind, being in any one position for a prolonged period of time, no matter how correct your posture, can still lead to stiffness. PROPER SITTING POSTURE In order to minimize stress and discomfort on your spine, you must sit with correct posture. Sitting with good posture should be effortless for a healthy body. Returning to good posture is a gradual process. Many people can work toward this most comfortably by using various supports until they have the flexibility and strength to maintain this posture on their own. When sitting with proper posture, your ears will fall over your shoulders and your shoulders will fall over your hips. You should use the back of the chair to support your upper back. Your lower back will be in a neutral position, just slightly arched. You may place a small pillow or folded towel at the base of your lower back for support.  When working at a desk, create an environment that supports good, upright posture. Without extra support, muscles tire, which leads to excessive strain on joints and other tissues. Keep these recommendations in mind: CHAIR:  A chair should be able to slide under your desk when your back makes contact with the back of the chair. This allows you to work closely.  The chair's  height should allow your eyes to be level with the upper part of your monitor and your hands to be slightly lower than your elbows. BODY POSITION  Your feet should make contact with the floor. If this is not possible, use a foot rest.  Keep your ears over your shoulders. This will reduce stress on your neck and  lower back. INCORRECT SITTING POSTURES  If you are feeling tired and unable to assume a healthy sitting posture, do not slouch or slump. This puts excessive strain on your back tissues, causing more damage and pain. Healthier options include:  Using more support, like a lumbar pillow.  Switching tasks to something that requires you to be upright or walking.  Talking a brief walk.  Lying down to rest in a neutral-spine position. PROLONGED STANDING WHILE SLIGHTLY LEANING FORWARD  When completing a task that requires you to lean forward while standing in one place for a long time, place either foot up on a stationary 2-4 inch high object to help maintain the best posture. When both feet are on the ground, the lower back tends to lose its slight inward curve. If this curve flattens (or becomes too large), then the back and your other joints will experience too much stress, tire more quickly, and can cause pain. CORRECT STANDING POSTURES Proper standing posture should be assumed with all daily activities, even if they only take a few moments, like when brushing your teeth. As in sitting, your ears should fall over your shoulders and your shoulders should fall over your hips. You should keep a slight tension in your abdominal muscles to brace your spine. Your tailbone should point down to the ground, not behind your body, resulting in an over-extended swayback posture.  INCORRECT STANDING POSTURES  Common incorrect standing postures include a forward head, locked knees and/or an excessive swayback. WALKING Walk with an upright posture. Your ears, shoulders and hips should all  line-up. PROLONGED ACTIVITY IN A FLEXED POSITION When completing a task that requires you to bend forward at your waist or lean over a low surface, try to find a way to stabilize 3 out of 4 of your limbs. You can place a hand or elbow on your thigh or rest a knee on the surface you are reaching across. This will provide you more stability so that your muscles do not fatigue as quickly. By keeping your knees relaxed, or slightly bent, you will also reduce stress across your lower back. CORRECT LIFTING TECHNIQUES DO :   Assume a wide stance. This will provide you more stability and the opportunity to get as close as possible to the object which you are lifting.  Tense your abdominals to brace your spine. Bend at the knees and hips. Keeping your back locked in a neutral-spine position, lift using your leg muscles. Lift with your legs, keeping your back straight.  Test the weight of unknown objects before attempting to lift them.  Try to keep your elbows locked down at your sides in order get the best strength from your shoulders when carrying an object.  Always ask for help when lifting heavy or awkward objects. INCORRECT LIFTING TECHNIQUES DO NOT:   Lock your knees when lifting, even if it is a small object.  Bend and twist. Pivot at your feet or move your feet when needing to change directions.  Assume that you can safely pick up even a paper clip without proper posture.   This information is not intended to replace advice given to you by your health care provider. Make sure you discuss any questions you have with your health care provider.   Document Released: 03/26/2005 Document Revised: 04/16/2014 Document Reviewed: 07/08/2008 [employer] Interactive Patient Education 2016 [employer]  Lumbosacral Strain Lumbosacral strain is a strain of any of the parts that make up your lumbosacral vertebrae. Your lumbosacral  vertebrae are the bones that make up the lower third of your backbone.  Your lumbosacral vertebrae are held together by muscles and tough, fibrous tissue (ligaments).  CAUSES  A sudden blow to your back can cause lumbosacral strain. Also, anything that causes an excessive stretch of the muscles in the low back can cause this strain. This is typically seen when people exert themselves strenuously, fall, lift heavy objects, bend, or crouch repeatedly. RISK FACTORS  Physically demanding work.  Participation in pushing or pulling sports or sports that require a sudden twist of the back (tennis, golf, baseball).  Weight lifting.  Excessive lower back curvature.  Forward-tilted pelvis.  Weak back or abdominal muscles or both.  Tight hamstrings. SIGNS AND SYMPTOMS  Lumbosacral strain may cause pain in the area of your injury or pain that moves (radiates) down your leg.  DIAGNOSIS Your health care provider can often diagnose lumbosacral strain through a physical exam. In some cases, you may need tests such as X-ray exams.  TREATMENT  Treatment for your lower back injury depends on many factors that your clinician will have to evaluate. However, most treatment will include the use of anti-inflammatory medicines. HOME CARE INSTRUCTIONS   Avoid hard physical activities (tennis, racquetball, waterskiing) if you are not in proper physical condition for it. This may aggravate or create problems.  If you have a back problem, avoid sports requiring sudden body movements. Swimming and walking are generally safer activities.  Maintain good posture.  Maintain a healthy weight.  For acute conditions, you may put ice on the injured area.  Put ice in a plastic bag.  Place a towel between your skin and the bag.  Leave the ice on for 20 minutes, 2-3 times a day.  When the low back starts healing, stretching and strengthening exercises may be recommended. SEEK MEDICAL CARE IF:  Your back pain is getting worse.  You experience severe back pain not relieved with  medicines. SEEK IMMEDIATE MEDICAL CARE IF:   You have numbness, tingling, weakness, or problems with the use of your arms or legs.  There is a change in bowel or bladder control.  You have increasing pain in any area of the body, including your belly (abdomen).  You notice shortness of breath, dizziness, or feel faint.  You feel sick to your stomach (nauseous), are throwing up (vomiting), or become sweaty.  You notice discoloration of your toes or legs, or your feet get very cold. MAKE SURE YOU:   Understand these instructions.  Will watch your condition.  Will get help right away if you are not doing well or get worse.   This information is not intended to replace advice given to you by your health care provider. Make sure you discuss any questions you have with your health care provider.   Document Released: 01/03/2005 Document Revised: 04/16/2014 Document Reviewed: 11/12/2012 Elsevier Interactive Patient Education 2016 [employer]  Muscle Strain A muscle strain is an injury that occurs when a muscle is stretched beyond its normal length. Usually a small number of muscle fibers are torn when this happens. Muscle strain is rated in degrees. First-degree strains have the least amount of muscle fiber tearing and pain. Second-degree and third-degree strains have increasingly more tearing and pain.  Usually, recovery from muscle strain takes 1-2 weeks. Complete healing takes 5-6 weeks.  CAUSES  Muscle strain happens when a sudden, violent force placed on a muscle stretches it too far. This may occur with lifting, sports, or  a fall.  RISK FACTORS Muscle strain is especially common in athletes.  SIGNS AND SYMPTOMS At the site of the muscle strain, there may be:  Pain.  Bruising.  Swelling.  Difficulty using the muscle due to pain or lack of normal function. DIAGNOSIS  Your health care provider will perform a physical exam and ask about your medical history. TREATMENT    Often, the best treatment for a muscle strain is resting, icing, and applying cold compresses to the injured area.  HOME CARE INSTRUCTIONS   Use the PRICE method of treatment to promote muscle healing during the first 2-3 days after your injury. The PRICE method involves:  Protecting the muscle from being injured again.  Restricting your activity and resting the injured body part.  Icing your injury. To do this, put ice in a plastic bag. Place a towel between your skin and the bag. Then, apply the ice and leave it on from 15-20 minutes each hour. After the third day, switch to moist heat packs.  Apply compression to the injured area with a splint or elastic bandage. Be careful not to wrap it too tightly. This may interfere with blood circulation or increase swelling.  Elevate the injured body part above the level of your heart as often as you can.  Only take over-the-counter or prescription medicines for pain, discomfort, or fever as directed by your health care provider.  Warming up prior to exercise helps to prevent future muscle strains. SEEK MEDICAL CARE IF:   You have increasing pain or swelling in the injured area.  You have numbness, tingling, or a significant loss of strength in the injured area. MAKE SURE YOU:   Understand these instructions.  Will watch your condition.  Will get help right away if you are not doing well or get worse.   This information is not intended to replace advice given to you by your health care provider. Make sure you discuss any questions you have with your health care provider.   Document Released: 03/26/2005 Document Revised: 01/14/2013 Document Reviewed: 10/23/2012 Elsevier Interactive Patient Education [employer]

## 2015-01-23 NOTE — ED Notes (Signed)
Pt c/o back pain onset 10/14 States she was bending over and she coughed at the same time Reports she felt a "pop".... Pain increases w/activity Denies urinary sx, fevers, chills Slow/steady gait; NAD; A&O x4... No acute distress.

## 2015-01-23 NOTE — ED Provider Notes (Signed)
CSN: [id number]     Arrival date & time 01/23/15  1428 History   First MD Initiated Contact with Patient 01/23/15 1519     Chief Complaint  Patient presents with  . Back Pain   (Consider location/radiation/quality/duration/timing/severity/associated sxs/prior Treatment) HPI Comments: 64 year old female was bending over in the same time she coughed and experienced sudden pain in the right para lumbar musculature. This occurred 2 days ago. She has been having nonradiating pain just right of the lumbar spine constantly for 2 days. It is worse with rising from a seated position or getting out of bed and other similar movements. Bending over and rotation of the spine produces pain. Denies radiation of pain. Denies radicular pain denies focal paresthesias or weakness. Denies saddle anesthesia, loss of bowel or bladder control.  Patient is a 64 y.o. female presenting with back pain.  Back Pain Associated symptoms: no fever     Past Medical History  Diagnosis Date  . GERD (gastroesophageal reflux disease)   . Migraine headache   . Hemorrhoids   . Constipation    Past Surgical History  Procedure Laterality Date  . Endometrial ablation    . Breast lumpectomy Right   . Tonsillectomy     No family history on file. Social History  Substance Use Topics  . Smoking status: Never Smoker   . Smokeless tobacco: None  . Alcohol Use: Yes   OB History    No data available     Review of Systems  Constitutional: Positive for activity change. Negative for fever and chills.  Respiratory: Negative.   Cardiovascular: Negative.   Musculoskeletal: Positive for myalgias, back pain and gait problem.       As per HPI  Skin: Negative for color change and rash.  Neurological: Negative.     Allergies  Codeine  Home Medications   Prior to Admission medications   Medication Sig Start Date End Date Taking? Authorizing Provider  RABEprazole (ACIPHEX) 20 MG tablet Take 20 mg by mouth daily.   Yes  Historical Provider, MD  acetaminophen (TYLENOL) 325 MG tablet Take 650 mg by mouth every 6 (six) hours as needed for pain.    Historical Provider, MD  BIOTIN PO Take 1 tablet by mouth daily.    Historical Provider, MD  calcium carbonate (OS-CAL) 600 MG TABS tablet Take 600 mg by mouth 2 (two) times daily with a meal.    Historical Provider, MD  diclofenac sodium (VOLTAREN) 1 % GEL Apply 1 application topically 4 (four) times daily. 01/23/15   Hayden Rasmussenavid Alick Lecomte, NP  diphenhydrAMINE (BENADRYL) 25 MG tablet Take 50 mg by mouth every 6 (six) hours as needed for itching.    Historical Provider, MD  meloxicam (MOBIC) 15 MG tablet Take 1 tablet (15 mg total) by mouth daily. 01/23/15   Hayden Rasmussenavid Aariv Medlock, NP  traMADol (ULTRAM) 50 MG tablet Take 1 tablet (50 mg total) by mouth every 6 (six) hours as needed. 01/23/15   Hayden Rasmussenavid Elecia Serafin, NP   Meds Ordered and Administered this Visit  Medications - No data to display  BP 128/90 mmHg  Pulse 60  Temp(Src) 97.8 F (36.6 C) (Oral)  Resp 16  SpO2 98% No data found.   Physical Exam  Constitutional: She is oriented to person, place, and time. She appears well-developed and well-nourished. No distress.  HENT:  Head: Normocephalic and atraumatic.  Eyes: EOM are normal.  Neck: Normal range of motion. Neck supple.  Cardiovascular: Normal rate.   Pulmonary/Chest: Effort normal. No  respiratory distress.  Musculoskeletal:  Focal tenderness just to the right of the lumbar spine involving the paraspinal muscles. Pain is exacerbated by standing on the left leg, leaning forward and other reproducible movements. There is no spinal tenderness. No swelling, deformity or overlying skin changes. No tenderness over the gluteus medius or maximus.  Neurological: She is alert and oriented to person, place, and time. No cranial nerve deficit. She exhibits normal muscle tone.  Skin: Skin is warm and dry.  Psychiatric: She has a normal mood and affect.  Nursing note and vitals  reviewed.   ED Course  Procedures (including critical care time)  Labs Review Labs Reviewed - No data to display  Imaging Review No results found.   Visual Acuity Review  Right Eye Distance:   Left Eye Distance:   Bilateral Distance:    Right Eye Near:   Left Eye Near:    Bilateral Near:         MDM   1. Lumbar strain, initial encounter   2. Muscle strain    Diclofenac gel mobic 15 mg  Tramadol #15 Heat , stretches, good body mechanics.    Hayden Rasmussen, NP 01/23/15 [phone]

## 2016-04-19 ENCOUNTER — Ambulatory Visit (INDEPENDENT_AMBULATORY_CARE_PROVIDER_SITE_OTHER): Payer: 59 | Admitting: Otolaryngology

## 2016-04-19 DIAGNOSIS — J310 Chronic rhinitis: Secondary | ICD-10-CM | POA: Diagnosis not present

## 2016-04-19 DIAGNOSIS — J330 Polyp of nasal cavity: Secondary | ICD-10-CM

## 2016-04-19 DIAGNOSIS — R05 Cough: Secondary | ICD-10-CM | POA: Diagnosis not present

## 2016-05-30 ENCOUNTER — Ambulatory Visit (INDEPENDENT_AMBULATORY_CARE_PROVIDER_SITE_OTHER): Payer: 59 | Admitting: Allergy & Immunology

## 2016-05-30 ENCOUNTER — Encounter: Payer: Self-pay | Admitting: Allergy & Immunology

## 2016-05-30 ENCOUNTER — Encounter (INDEPENDENT_AMBULATORY_CARE_PROVIDER_SITE_OTHER): Payer: Self-pay

## 2016-05-30 VITALS — BP 108/76 | HR 81 | Temp 98.0°F | Resp 16 | Ht 62.5 in | Wt 164.2 lb

## 2016-05-30 DIAGNOSIS — R059 Cough, unspecified: Secondary | ICD-10-CM

## 2016-05-30 DIAGNOSIS — J310 Chronic rhinitis: Secondary | ICD-10-CM

## 2016-05-30 DIAGNOSIS — J4540 Moderate persistent asthma, uncomplicated: Secondary | ICD-10-CM

## 2016-05-30 DIAGNOSIS — R05 Cough: Secondary | ICD-10-CM

## 2016-05-30 DIAGNOSIS — J683 Other acute and subacute respiratory conditions due to chemicals, gases, fumes and vapors: Secondary | ICD-10-CM

## 2016-05-30 MED ORDER — AZELASTINE-FLUTICASONE 137-50 MCG/ACT NA SUSP: 2.0000 | Freq: Every day | NASAL | 5 refills | Status: DC

## 2016-05-30 MED ORDER — RANITIDINE HCL 150 MG PO TABS: 300.0000 mg | ORAL_TABLET | Freq: Every day | ORAL | 5 refills | Status: DC

## 2016-05-30 MED ORDER — DEXLANSOPRAZOLE 30 MG PO CPDR: 30.0000 mg | DELAYED_RELEASE_CAPSULE | ORAL | 5 refills | Status: DC

## 2016-05-30 MED ORDER — LEVOCETIRIZINE DIHYDROCHLORIDE 5 MG PO TABS: 5.0000 mg | ORAL_TABLET | Freq: Every evening | ORAL | 5 refills | Status: DC

## 2016-05-30 NOTE — Progress Notes (Signed)
NEW PATIENT  Date of Service/Encounter:  05/30/16  Referring provider: Colette RibasGOLDING, JOHN CABOT, MD   Assessment:    Chronic allergic rhinitis (grass, weeds, ragweed, trees, molds, cat, dog, cockroach, and dust mite)  Cough - likely secondary to uncontrolled allergic rhinitis versus uncontrolled GERD  Reactive airways dysfunction syndrome  GERD    Plan/Recommendations:   1. Chronic rhinitis - Testing today showed: Positives to grass, weeds, ragweed, trees, molds, cat, dog, cockroach, and dust mite - Avoidance measures provided.  - I recommend starting Dymista two sprays per nostril once daily to see if this helps. - Add Xyzal 5mg  daily.  - Consider starting allergy shots if there is no improvement with the above.  2. Cough - Spirometry was normal today. - Since your cough seems to be triggered by scents and spices, I think you may have something called reactive airway dysfunction syndrome (essentially asthma symptoms initiated by environmental irritants). - We will give you albuterol to see if this can help with your episodes.  - Reflux is likely contributing to your symptoms as well.  3. Reflux - Start Dexilant 30mg  every morning (samples provided). - Continue with Zantac but increase to 300mg  at night.   4. Return in about 3 months (around 08/27/2016).   Subjective:   [name] is a 66 y.o. female presenting today for evaluation of  Chief Complaint  Patient presents with  . Allergic Rhinitis     runny nose, itchy/watery eyes. does have mold issue in house at the moment.   . Cough    x2 years  . Allergic Reaction    deodorants.     Amy BarreDeborah F Magill has a history of the following: There are no active problems to display for this patient.   History obtained from: chart review and patient.  Amy Barreeborah F Plaisted was referred by Colette RibasGOLDING, JOHN CABOT, MD.     [name] is a 66 y.o. female presenting for an evaluation of allergic rhinitis. She was evaluated by  Dr. Suszanne Connerseoh one month ago for nasal congestion and cough. At that visit, she was diagnosed with a left-sided polyp and a deviated septum. 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 She has chronic nasal congestion throughout the entire year for the last couple of years. Prior to this, she was having more problems in the fall. She was started on a nasal spray (maybe ipratropium) with continued coughing despite this. She was also treating this with Mucinex D. She has tried a variety of antihistamines without improvement. She has not tried the Xyzal. She has tried nasal saline only with colds. The only thing new is that they had some basement flooding.   She has had a dry hacky cough with intermittent wet productive spells for two years. These are typically in the morning and right after supper. These are also triggered by certain spices to which she is exposed when she grills out with friends. She does take Zantac every day. She was diagnosed with bronchitis around three years ago and received an inhaler. She has not needed the inhaler since that time. She does have a history of osteopenia and was on a weekly bisphosphonate. However this was discontinued when she had esophageal spasms. She does have a history of underlying GERD and takes Zantac on a daily basis.   Otherwise, there is no history of other atopic diseases, including asthma, drug allergies, food allergies, stinging insect allergies, or urticaria. There is no significant infectious  history. Vaccinations are up to date.    Past Medical History: There are no active problems to display for this patient.   Medication List:  Allergies as of 05/30/2016      Reactions   Codeine Nausea And Vomiting      Medication List       Accurate as of 05/30/16 10:17 PM. Always use your most recent med list.          acetaminophen 325 MG tablet Commonly known as:  TYLENOL Take 650 mg by mouth every 6 (six) hours as needed for pain.    Azelastine-Fluticasone 137-50 MCG/ACT Susp Place 2 sprays into the nose daily.   Dexlansoprazole 30 MG capsule Commonly known as:  DEXILANT Take 1 capsule (30 mg total) by mouth every morning.   diphenhydrAMINE 25 MG tablet Commonly known as:  BENADRYL Take 50 mg by mouth every 6 (six) hours as needed for itching.   gabapentin 300 MG capsule Commonly known as:  NEURONTIN   ibuprofen 200 MG tablet Commonly known as:  ADVIL,MOTRIN Take 400 mg by mouth every 6 (six) hours as needed.   ipratropium 0.06 % nasal spray Commonly known as:  ATROVENT   levocetirizine 5 MG tablet Commonly known as:  XYZAL Take 1 tablet (5 mg total) by mouth every evening.   ranitidine 150 MG tablet Commonly known as:  ZANTAC Take 2 tablets (300 mg total) by mouth at bedtime.       Birth History: non-contributory.   Developmental History: non-contributory.   Past Surgical History: Past Surgical History:  Procedure Laterality Date  . ADENOIDECTOMY    . BREAST LUMPECTOMY Right   . ENDOMETRIAL ABLATION    . STAPEDES SURGERY Right   . TONSILLECTOMY       Family History: Family History  Problem Relation Age of Onset  . Allergic rhinitis Mother     medications  . COPD Mother   . Urticaria Mother   . Allergic rhinitis Father   . Asthma Father   . Urticaria Father   . Asthma Brother   . Psoriasis Son   . Psoriasis Daughter      Social History: Ayelen lives at home with her husband. They have grown and married children. There are two grandchildren. They do have a dog who stays inside in the winter but typically he stays outdoors. 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   Review of Systems: a 14-point review of systems is pertinent for what is mentioned in HPI.  Otherwise, all other systems were negative. Constitutional: negative other than that listed in the HPI Eyes: negative other than that listed in the HPI Ears, nose, mouth, throat, and face: negative other  than that listed in the HPI Respiratory: negative other than that listed in the HPI Cardiovascular: negative other than that listed in the HPI Gastrointestinal: negative other than that listed in the HPI Genitourinary: negative other than that listed in the HPI Integument: negative other than that listed in the HPI Hematologic: negative other than that listed in the HPI Musculoskeletal: negative other than that listed in the HPI Neurological: negative other than that listed in the HPI Allergy/Immunologic: negative other than that listed in the HPI    Objective:   Blood pressure 108/76, pulse 81, temperature 98 F (36.7 C), resp. rate 16, height 5' 2.5" (1.588 m), weight 164 lb 3.2 oz (74.5 kg), SpO2 96 %. Body mass index is 29.55 kg/m.   Physical Exam:  General:  Alert, interactive, in no acute distress. Cooperative with the exam. Very pleasant.  Eyes: No conjunctival injection present on the right, No conjunctival injection present on the left, PERRL bilaterally, No discharge on the right, No discharge on the left and No Horner-Trantas dots present Ears: Right TM pearly gray with normal light reflex, Left TM pearly gray with normal light reflex, Right TM intact without perforation and Left TM intact without perforation.  Nose/Throat: External nose within normal limits, nasal crease present and septum midline, turbinates edematous without discharge, post-pharynx mildly erythematous without cobblestoning in the posterior oropharynx. Tonsils 2+ without exudates Neck: Supple without thyromegaly. Adenopathy: no enlarged lymph nodes appreciated in the anterior cervical, occipital, axillary, epitrochlear, inguinal, or popliteal regions Lungs: Clear to auscultation without wheezing, rhonchi or rales. No increased work of breathing. Coughing during the exam especially after the spirometry.  CV: Normal S1/S2, no murmurs. Capillary refill <2 seconds.  Abdomen: Nondistended, nontender. No guarding  or rebound tenderness. Bowel sounds present in all fields and hypoactive  Skin: Warm and dry, without lesions or rashes. Extremities:  No clubbing, cyanosis or edema. Neuro:   Grossly intact. No focal deficits appreciated. Responsive to questions.  Diagnostic studies:  Spirometry: results abnormal (FEV1: 2.31/107%, FVC: 2.67/99%, FEV1/FVC: 86%).    Spirometry consistent with normal pattern.   Allergy Studies:   Indoor/Outdoor Percutaneous Adult Environmental Panel: negative to the entire panel with adequate controls.  Indoor/Outdoor Selected Intradermal Environmental Panel: positive to [place] grass, [place] grass, Grass mix, ragweed mix, weed mix, tree mix, mold mix #2, mold mix #4, cat, dog, cockroach and mite mix. Otherwise negative with adequate controls.  Most Common Foods Panel (peanut, cashew, soy, fish mix, shellfish mix, wheat, milk, egg): negative to all with adequate controls      Malachi Bonds, MD [facility]

## 2016-05-30 NOTE — Patient Instructions (Addendum)
1. Chronic rhinitis - Testing today showed: Positives to grass, weeds, ragweed, trees, molds, cat, dog, cockroach, and dust mite - Avoidance measures provided.  - I recommend starting Dymista two sprays per nostril once daily to see if this helps. - Add Xyzal 5mg  daily.  - Consider starting allergy shots if there is no improvement with the above.  2. Cough - Spirometry was normal today. - Since your cough seems to be triggered by scents and spices, I think you may have something called reactive airway dysfunction syndrome (essentially asthma symptoms initiated by environmental irritants). - We will give you albuterol to see if this can help with your episodes.  - Reflux is likely contributing to your symptoms as well.  3. Reflux - Start Dexilant 30mg  every morning (samples provided). - Continue with Zantac but increase to 300mg  at night.   4. Return in about 3 months (around 08/27/2016).  Please inform us of any Emergency Department visits, hospitalizations, or changes in symptoms. Call us before going to the ED for breathing or allergy symptoms since we might be able to fit you in for a sick visit. Feel free to contact us anytime with any questions, problems, or concerns.  It was a pleasure to meet you today! Best wishes in the [place] Year! Enjoy the beach and chocolate!    Websites that have reliable patient information: 1. American Academy of Asthma, Allergy, and Immunology: www.aaaai.org 2. Food Allergy Research and Education (FARE): foodallergy.org 3. Mothers of Asthmatics: http://www.asthmacommunitynetwork.org 4. American College of Allergy, Asthma, and Immunology: www.acaai.org   Reducing Pollen Exposure  The American Academy of Allergy, Asthma and Immunology suggests the following steps to reduce your exposure to pollen during allergy seasons.    1. Do not hang sheets or clothing out to dry; pollen may collect on these items. 2. Do not mow lawns or spend time around freshly cut  grass; mowing stirs up pollen. 3. Keep windows closed at night.  Keep car windows closed while driving. 4. Minimize morning activities outdoors, a time when pollen counts are usually at their highest. 5. Stay indoors as much as possible when pollen counts or humidity is high and on windy days when pollen tends to remain in the air longer. 6. Use air conditioning when possible.  Many air conditioners have filters that trap the pollen spores. 7. Use a HEPA room air filter to remove pollen form the indoor air you breathe.  Control of Mold Allergen  Mold and fungi can grow on a variety of surfaces provided certain temperature and moisture conditions exist.  Outdoor molds grow on plants, decaying vegetation and soil.  The major outdoor mold, Alternaria and Cladosporium, are found in very high numbers during hot and dry conditions.  Generally, a late Summer - Fall peak is seen for common outdoor fungal spores.  Rain will temporarily lower outdoor mold spore count, but counts rise rapidly when the rainy period ends.  The most important indoor molds are Aspergillus and Penicillium.  Dark, humid and poorly ventilated basements are ideal sites for mold growth.  The next most common sites of mold growth are the bathroom and the kitchen.  Outdoor [employer] 1. Use air conditioning and keep windows closed 2. Avoid exposure to decaying vegetation. 3. Avoid leaf raking. 4. Avoid grain handling. 5. Consider wearing a face mask if working in moldy areas.  Indoor Mold Control 1. Maintain humidity below 50%. 2. Clean washable surfaces with 5% bleach solution. 3. Remove sources e.g. contaminated carpets.  Control of House Dust Mite Allergen    House dust mites play a major role in allergic asthma and rhinitis.  They occur in environments with high humidity wherever human skin, the food for dust mites is found. High levels have been detected in dust obtained from mattresses, pillows, carpets, upholstered  furniture, bed covers, clothes and soft toys.  The principal allergen of the house dust mite is found in its feces.  A gram of dust may contain 1,000 mites and 250,000 fecal particles.  Mite antigen is easily measured in the air during house cleaning activities.    1. Encase mattresses, including the box spring, and pillow, in an air tight cover.  Seal the zipper end of the encased mattresses with wide adhesive tape. 2. Wash the bedding in water of 130 degrees Farenheit weekly.  Avoid cotton comforters/quilts and flannel bedding: the most ideal bed covering is the dacron comforter. 3. Remove all upholstered furniture from the bedroom. 4. Remove carpets, carpet padding, rugs, and non-washable window drapes from the bedroom.  Wash drapes weekly or use plastic window coverings. 5. Remove all non-washable stuffed toys from the bedroom.  Wash stuffed toys weekly. 6. Have the room cleaned frequently with a vacuum cleaner and a damp dust-mop.  The patient should not be in a room which is being cleaned and should wait 1 hour after cleaning before going into the room. 7. Close and seal all heating outlets in the bedroom.  Otherwise, the room will become filled with dust-laden air.  An electric heater can be used to heat the room. 8. Reduce indoor humidity to less than 50%.  Do not use a humidifier.  Control of Dog or Cat Allergen  Avoidance is the best way to manage a dog or cat allergy. If you have a dog or cat and are allergic to dog or cats, consider removing the dog or cat from the home. If you have a dog or cat but don't want to find it a new home, or if your family wants a pet even though someone in the household is allergic, here are some strategies that may help keep symptoms at bay:  1. Keep the pet out of your bedroom and restrict it to only a few rooms. Be advised that keeping the dog or cat in only one room will not limit the allergens to that room. 2. Don't pet, hug or kiss the dog or cat; if  you do, wash your hands with soap and water. 3. High-efficiency particulate air (HEPA) cleaners run continuously in a bedroom or living room can reduce allergen levels over time. 4. Regular use of a high-efficiency vacuum cleaner or a central vacuum can reduce allergen levels. 5. Giving your dog or cat a bath at least once a week can reduce airborne allergen.  Control of Cockroach Allergen  Cockroach allergen has been identified as an important cause of acute attacks of asthma, especially in urban settings.  There are fifty-five species of cockroach that exist in the [place], however only three, the [place], [place] and Oriental species produce allergen that can affect patients with Asthma.  Allergens can be obtained from fecal particles, egg casings and secretions from cockroaches.    1. Remove food sources. 2. Reduce access to water. 3. Seal access and entry points. 4. Spray runways with 0.5-1% Diazinon or Chlorpyrifos 5. Blow boric acid power under stoves and refrigerator. 6. Place bait stations (hydramethylnon) at feeding sites.

## 2016-05-31 ENCOUNTER — Ambulatory Visit (INDEPENDENT_AMBULATORY_CARE_PROVIDER_SITE_OTHER): Payer: 59 | Admitting: Otolaryngology

## 2016-05-31 DIAGNOSIS — R05 Cough: Secondary | ICD-10-CM | POA: Diagnosis not present

## 2016-05-31 DIAGNOSIS — R42 Dizziness and giddiness: Secondary | ICD-10-CM | POA: Diagnosis not present

## 2016-05-31 DIAGNOSIS — J310 Chronic rhinitis: Secondary | ICD-10-CM | POA: Diagnosis not present

## 2016-05-31 DIAGNOSIS — J342 Deviated nasal septum: Secondary | ICD-10-CM

## 2016-07-02 ENCOUNTER — Ambulatory Visit (HOSPITAL_COMMUNITY)
Admission: RE | Admit: 2016-07-02 | Discharge: 2016-07-02 | Disposition: A | Discharge status: Home / Self Care | Payer: 59 | Source: Ambulatory Visit | Attending: Family Medicine | Admitting: Family Medicine

## 2016-07-02 ENCOUNTER — Other Ambulatory Visit (HOSPITAL_COMMUNITY): Payer: Self-pay | Admitting: Family Medicine

## 2016-07-02 DIAGNOSIS — I6522 Occlusion and stenosis of left carotid artery: Secondary | ICD-10-CM | POA: Insufficient documentation

## 2016-07-02 DIAGNOSIS — M25512 Pain in left shoulder: Secondary | ICD-10-CM | POA: Diagnosis not present

## 2016-07-09 ENCOUNTER — Telehealth: Payer: Self-pay | Admitting: Gastroenterology

## 2016-07-09 NOTE — Telephone Encounter (Signed)
May recall for tcs

## 2016-07-10 NOTE — Telephone Encounter (Signed)
Letter mailed to pt.  

## 2016-08-16 ENCOUNTER — Emergency Department (HOSPITAL_COMMUNITY): Payer: 59

## 2016-08-16 ENCOUNTER — Encounter (HOSPITAL_COMMUNITY): Payer: Self-pay | Admitting: *Deleted

## 2016-08-16 ENCOUNTER — Emergency Department (HOSPITAL_COMMUNITY)
Admission: EM | Admit: 2016-08-16 | Discharge: 2016-08-17 | Disposition: A | Discharge status: Home / Self Care | Payer: 59 | Attending: Emergency Medicine | Admitting: Emergency Medicine

## 2016-08-16 DIAGNOSIS — Y999 Unspecified external cause status: Secondary | ICD-10-CM | POA: Insufficient documentation

## 2016-08-16 DIAGNOSIS — Y929 Unspecified place or not applicable: Secondary | ICD-10-CM | POA: Insufficient documentation

## 2016-08-16 DIAGNOSIS — S8991XA Unspecified injury of right lower leg, initial encounter: Secondary | ICD-10-CM | POA: Diagnosis present

## 2016-08-16 DIAGNOSIS — S82141A Displaced bicondylar fracture of right tibia, initial encounter for closed fracture: Secondary | ICD-10-CM | POA: Insufficient documentation

## 2016-08-16 DIAGNOSIS — Z79899 Other long term (current) drug therapy: Secondary | ICD-10-CM | POA: Insufficient documentation

## 2016-08-16 DIAGNOSIS — W208XXA Other cause of strike by thrown, projected or falling object, initial encounter: Secondary | ICD-10-CM | POA: Insufficient documentation

## 2016-08-16 DIAGNOSIS — Y9389 Activity, other specified: Secondary | ICD-10-CM | POA: Insufficient documentation

## 2016-08-16 DIAGNOSIS — S82131A Displaced fracture of medial condyle of right tibia, initial encounter for closed fracture: Secondary | ICD-10-CM

## 2016-08-16 DIAGNOSIS — Z7722 Contact with and (suspected) exposure to environmental tobacco smoke (acute) (chronic): Secondary | ICD-10-CM | POA: Insufficient documentation

## 2016-08-16 DIAGNOSIS — M545 Low back pain: Secondary | ICD-10-CM | POA: Insufficient documentation

## 2016-08-16 MED ORDER — DIAZEPAM 5 MG PO TABS
10.0000 mg | ORAL_TABLET | Freq: Once | ORAL | Status: AC
  Administered 2016-08-16: 10 mg via ORAL
  Filled 2016-08-16: qty 2

## 2016-08-16 MED ORDER — ONDANSETRON HCL 4 MG PO TABS
4.0000 mg | ORAL_TABLET | Freq: Once | ORAL | Status: AC
  Administered 2016-08-16: 4 mg via ORAL
  Filled 2016-08-16: qty 1

## 2016-08-16 MED ORDER — HYDROCODONE-ACETAMINOPHEN 5-325 MG PO TABS
1.0000 | ORAL_TABLET | Freq: Once | ORAL | Status: AC
  Administered 2016-08-16: 1 via ORAL
  Filled 2016-08-16: qty 1

## 2016-08-16 NOTE — ED Triage Notes (Signed)
Pt states she was helping move a couch today and the couch fell and landed on her right leg; pt has pain to her right hip and right ankle

## 2016-08-16 NOTE — ED Notes (Signed)
ED Provider at bedside. 

## 2016-08-16 NOTE — ED Provider Notes (Signed)
[facility] DEPT Provider Note   CSN: [id number] Arrival date & time: 08/16/16  2132     History   Chief Complaint Chief Complaint  Patient presents with  . Leg Pain    HPI [name] is a 66 y.o. female.  Patient is a 66 year old female who presents to the emergency department with a complaint of lower back area pain, as well as right knee pain.  The patient states that she was assisting moving a couch off of a truck. She somehow lost her footing, her knee twisted under her, and the couch and her leg and in all position. Since that time she's been having lower back pain extending into the right hip area. She's also had knee pain. She applied ice, that help with the puffiness initially. But when she attempted to lay down to go to bed she has increasing pain in her knee and in her hip area. Having the leg straight seems to cause more pain, but rest does not completely resolve the issue. The patient also complains of some soreness of the right ankle, but has not had a lot of swelling and only minimal pain in this area. She denies being on any anticoagulation medications. She's not had any previous operations involving her back, knee, or ankle.      Past Medical History:  Diagnosis Date  . Constipation   . GERD (gastroesophageal reflux disease)   . Hemorrhoids   . Migraine headache   . Urticaria    Deodorants    There are no active problems to display for this patient.   Past Surgical History:  Procedure Laterality Date  . ADENOIDECTOMY    . BREAST LUMPECTOMY Right   . ENDOMETRIAL ABLATION    . STAPEDES SURGERY Right   . TONSILLECTOMY      OB History    No data available       Home Medications    Prior to Admission medications   Medication Sig Start Date End Date Taking? Authorizing Provider  acetaminophen (TYLENOL) 325 MG tablet Take 650 mg by mouth every 6 (six) hours as needed for pain.    [provider]  Azelastine-Fluticasone 137-50  MCG/ACT SUSP Place 2 sprays into the nose daily. 05/30/16   Alfonse SpruceGallagher, Joel Louis, MD  Dexlansoprazole [facility](DEXILANT) 30 MG capsule Take 1 capsule (30 mg total) by mouth every morning. 05/30/16   Alfonse SpruceGallagher, Joel Louis, MD  diphenhydrAMINE (BENADRYL) 25 MG tablet Take 50 mg by mouth every 6 (six) hours as needed for itching.    [provider]  gabapentin (NEURONTIN) 300 MG capsule  04/30/16   [provider]  ibuprofen (ADVIL,MOTRIN) 200 MG tablet Take 400 mg by mouth every 6 (six) hours as needed.    [provider]  ipratropium (ATROVENT) 0.06 % nasal spray  04/19/16   [provider]  levocetirizine (XYZAL) 5 MG tablet Take 1 tablet (5 mg total) by mouth every evening. 05/30/16   Alfonse SpruceGallagher, Joel Louis, MD  ranitidine (ZANTAC) 150 MG tablet Take 2 tablets (300 mg total) by mouth at bedtime. 05/30/16   Alfonse SpruceGallagher, Joel Louis, MD    Family History Family History  Problem Relation Age of Onset  . Allergic rhinitis Mother        medications  . COPD Mother   . Urticaria Mother   . Allergic rhinitis Father   . Asthma Father   . Urticaria Father   . Asthma Brother   . Psoriasis Son   . Psoriasis  Daughter     Social History Social History  Substance Use Topics  . Smoking status: Passive Smoke Exposure - Never Smoker    Types: Cigars  . Smokeless tobacco: Never Used     Comment: spouse smokes, but not in home  . Alcohol use Yes     Allergies   Codeine   Review of Systems Review of Systems  Musculoskeletal: Positive for arthralgias and back pain.  All other systems reviewed and are negative.    Physical Exam Updated Vital Signs BP 117/90 (BP Location: Right Arm)   Pulse 84   Temp 98 F (36.7 C) (Oral)   Resp 18   Ht 5\' 3"  (1.6 m)   Wt 74.8 kg   SpO2 100%   BMI 29.23 kg/m   Physical Exam  Constitutional: She is oriented to person, place, and time. She appears well-developed and well-nourished.  Non-toxic appearance.  HENT:  Head:  Normocephalic.  Right Ear: Tympanic membrane and external ear normal.  Left Ear: Tympanic membrane and external ear normal.  Eyes: EOM and lids are normal. Pupils are equal, round, and reactive to light.  Neck: Normal range of motion. Neck supple. Carotid bruit is not present.  Cardiovascular: Normal rate, regular rhythm, normal heart sounds, intact distal pulses and normal pulses.   Pulmonary/Chest: Breath sounds normal. No respiratory distress.  Abdominal: Soft. Bowel sounds are normal. There is no tenderness. There is no guarding.  Musculoskeletal: Normal range of motion.       Right knee: She exhibits no effusion and no deformity. Tenderness found. Lateral joint line tenderness noted.       Right ankle: Tenderness. Lateral malleolus tenderness found.       Lumbar back: She exhibits pain and spasm.       Back:  Lymphadenopathy:       Head (right side): No submandibular adenopathy present.       Head (left side): No submandibular adenopathy present.    She has no cervical adenopathy.  Neurological: She is alert and oriented to person, place, and time. She has normal strength. No cranial nerve deficit or sensory deficit.  Skin: Skin is warm and dry.  Psychiatric: She has a normal mood and affect. Her speech is normal.  Nursing note and vitals reviewed.    ED Treatments / Results  Labs (all labs ordered are listed, but only abnormal results are displayed) Labs Reviewed - No data to display  EKG  EKG Interpretation None       Radiology No results found.  Procedures FRACTURE CARE. Marland KitchenSplint Application Date/Time: 08/16/2016 12:11 AM Performed by: Ivery Quale Authorized by: Ivery Quale   Consent:    Consent obtained:  Verbal   Consent given by:  Patient   Risks discussed:  Pain and swelling Pre-procedure details:    Sensation:  Normal   Skin color:  Normal Procedure details:    Laterality:  Right   Location:  Knee   Knee:  R knee   Splint type:  Knee  immobilizer   Supplies used: knee immobilizer and crutches. Post-procedure details:    Pain:  Improved   Sensation:  Normal   Patient tolerance of procedure:  Tolerated well, no immediate complications   (including critical care time)  Medications Ordered in ED Medications  diazepam (VALIUM) tablet 10 mg (not administered)  HYDROcodone-acetaminophen (NORCO/VICODIN) 5-325 MG per tablet 1 tablet (not administered)  ondansetron (ZOFRAN) tablet 4 mg (not administered)     Initial Impression / Assessment and  Plan / ED Course  I have reviewed the triage vital signs and the nursing notes.  Pertinent labs & imaging results that were available during my care of the patient were reviewed by me and considered in my medical decision making (see chart for details).       Final Clinical Impressions(s) / ED Diagnoses MDM There no gross neurovascular deficits appreciated on examination. Vital signs reviewed. The x-ray suggest tibial plateau fracture on the right. I reviewed the x-rays with the patient in terms which he understood. Knee immobilizer was applied. Ice pack was provided. Pain medication was provided, and crutches were provided. The patient will obtain a walker on tomorrow. Prescription for Norco and Zofran given to the patient. Patient referred to orthopedics.    Final diagnoses:  Right medial tibial plateau fracture, closed, initial encounter    New Prescriptions Discharge Medication List as of 08/17/2016 12:28 AM    START taking these medications   Details  !! HYDROcodone-acetaminophen (NORCO/VICODIN) 5-325 MG tablet Take 1-2 tablets by mouth every 4 (four) hours as needed., Starting Fri 08/17/2016, Print    !! HYDROcodone-acetaminophen (NORCO/VICODIN) 5-325 MG tablet Take 1 tablet by mouth every 4 (four) hours as needed., Starting Fri 08/17/2016, Print    ondansetron (ZOFRAN) 4 MG tablet Take 1 tablet (4 mg total) by mouth every 6 (six) hours., Starting Fri 08/17/2016, Print       !! - Potential duplicate medications found. Please discuss with provider.       Ivery Quale, PA-C 08/18/16 0035    Samuel Jester, DO 08/19/16 1551

## 2016-08-17 MED ORDER — ONDANSETRON HCL 4 MG PO TABS: 4.0000 mg | ORAL_TABLET | Freq: Four times a day (QID) | ORAL | 0 refills | Status: DC

## 2016-08-17 MED ORDER — HYDROCODONE-ACETAMINOPHEN 5-325 MG PO TABS: 1.0000 | ORAL_TABLET | ORAL | 0 refills | Status: DC | PRN

## 2016-08-17 NOTE — Discharge Instructions (Signed)
Your x-ray suggest a fracture of the right knee, or tibial plateau. Please keep your leg elevated above your waist and apply ice. Please use crutches or walker when getting about, and do not put pressure on your right lower extremity. Please see Dr.Duda for orthopedic management of this fracture as soon as possible. You may continue to use your muscle relaxer. Please add Norco for pain area and you may use Zofran for nausea if needed. Norco may cause drowsiness. Please use this medication with caution.

## 2016-08-20 ENCOUNTER — Ambulatory Visit (INDEPENDENT_AMBULATORY_CARE_PROVIDER_SITE_OTHER): Payer: 59 | Admitting: Family

## 2016-08-20 DIAGNOSIS — S82141A Displaced bicondylar fracture of right tibia, initial encounter for closed fracture: Secondary | ICD-10-CM | POA: Diagnosis not present

## 2016-08-20 NOTE — Progress Notes (Signed)
Office Visit Note   Patient: Amy BarreDeborah F Tomerlin           Date of Birth: [birth year]           MRN: [id number] Visit Date: 08/20/2016              Requested by: Assunta FoundGolding, John, MD [address] [place], [place] [address] PCP: Assunta FoundGolding, John, MD  No chief complaint on file.     HPI: The patient is a 66 year old woman who was moving a couch over the weekend. She was taking it down on the truck bed with her husband. The couch fell down on top of her. States she twisted her knee out laterally. Was seen and evaluated in the emergency department. Sustained a tibial plateau practice sure with minimal depression on the right. Is in a knee immobilizer today using crutches for nonweightbearing. Reports pain is bearable as long as her knees kept in extension.  Assessment & Plan: Visit Diagnoses:  1. Closed fracture of right tibial plateau, initial encounter     Plan: Continue immobilization for next 3 weeks. nonweight bearing. Have provided an order for a BLedsoe in full extension to [facility]. Follow up in 3 weeks with radiographs of right knee.   Follow-Up Instructions: Return in about 3 weeks (around 09/10/2016).   Ortho Exam  Patient is alert, oriented, no adenopathy, well-dressed, normal affect, normal respiratory effort. Right knee swelling. MCL And LCL nontender. eccymosis to posterior calf. Strong DP pulse.   Imaging: No results found.  Labs: No results found for: HGBA1C, ESRSEDRATE, CRP, LABURIC, REPTSTATUS, GRAMSTAIN, CULT, LABORGA  Orders:  No orders of the defined types were placed in this encounter.  No orders of the defined types were placed in this encounter.    Procedures: No procedures performed  Clinical Data: No additional findings.  ROS:  All other systems negative, except as noted in the HPI. Review of Systems  Constitutional: Negative for chills and fever.  Musculoskeletal: Positive for arthralgias, gait problem and joint swelling.  Skin: Positive for  color change.    Objective: Vital Signs: There were no vitals taken for this visit.  Specialty Comments:  No specialty comments available.  PMFS History: Patient Active Problem List   Diagnosis Date Noted  . Closed fracture of right tibial plateau 08/20/2016   Past Medical History:  Diagnosis Date  . Constipation   . GERD (gastroesophageal reflux disease)   . Hemorrhoids   . Migraine headache   . Urticaria    Deodorants    Family History  Problem Relation Age of Onset  . Allergic rhinitis Mother        medications  . COPD Mother   . Urticaria Mother   . Allergic rhinitis Father   . Asthma Father   . Urticaria Father   . Asthma Brother   . Psoriasis Son   . Psoriasis Daughter     Past Surgical History:  Procedure Laterality Date  . ADENOIDECTOMY    . BREAST LUMPECTOMY Right   . ENDOMETRIAL ABLATION    . STAPEDES SURGERY Right   . TONSILLECTOMY     Social History   Occupational History  . Not on file.   Social History Main Topics  . Smoking status: Passive Smoke Exposure - Never Smoker    Types: Cigars  . Smokeless tobacco: Never Used     Comment: spouse smokes, but not in home  . Alcohol use Yes  . Drug use: No  . Sexual  activity: Not on file

## 2016-08-24 ENCOUNTER — Telehealth (INDEPENDENT_AMBULATORY_CARE_PROVIDER_SITE_OTHER): Payer: Self-pay | Admitting: Orthopedic Surgery

## 2016-08-24 NOTE — Telephone Encounter (Signed)
08/20/2016 OV NOTE FAXED TO [facility] W/ CMN [id number] TO [phone]

## 2016-08-28 ENCOUNTER — Encounter: Payer: Self-pay | Admitting: Allergy & Immunology

## 2016-08-28 ENCOUNTER — Ambulatory Visit (INDEPENDENT_AMBULATORY_CARE_PROVIDER_SITE_OTHER): Payer: 59 | Admitting: Allergy & Immunology

## 2016-08-28 VITALS — BP 110/80 | HR 69 | Temp 97.4°F | Resp 17

## 2016-08-28 DIAGNOSIS — R05 Cough: Secondary | ICD-10-CM | POA: Diagnosis not present

## 2016-08-28 DIAGNOSIS — J3089 Other allergic rhinitis: Secondary | ICD-10-CM

## 2016-08-28 DIAGNOSIS — R059 Cough, unspecified: Secondary | ICD-10-CM

## 2016-08-28 DIAGNOSIS — J683 Other acute and subacute respiratory conditions due to chemicals, gases, fumes and vapors: Secondary | ICD-10-CM | POA: Diagnosis not present

## 2016-08-28 DIAGNOSIS — J302 Other seasonal allergic rhinitis: Secondary | ICD-10-CM | POA: Diagnosis not present

## 2016-08-28 MED ORDER — LEVOCETIRIZINE DIHYDROCHLORIDE 5 MG PO TABS: 5.0000 mg | ORAL_TABLET | Freq: Every evening | ORAL | 5 refills | Status: DC

## 2016-08-28 MED ORDER — AZELASTINE-FLUTICASONE 137-50 MCG/ACT NA SUSP: 2.0000 | Freq: Every day | NASAL | 5 refills | Status: DC

## 2016-08-28 MED ORDER — DEXLANSOPRAZOLE 30 MG PO CPDR: 30.0000 mg | DELAYED_RELEASE_CAPSULE | ORAL | 5 refills | Status: DC

## 2016-08-28 MED ORDER — RANITIDINE HCL 150 MG PO TABS: 300.0000 mg | ORAL_TABLET | Freq: Every day | ORAL | 5 refills | Status: DC

## 2016-08-28 NOTE — Progress Notes (Signed)
FOLLOW UP  Date of Service/Encounter:  08/28/16   Assessment:   Reactive airways dysfunction syndrome  Cough  Perennial allergic rhinitis (grasses, weeds, ragweed, trees, molds, cat, dog, cockroach, dust mite)   Plan/Recommendations:   1. Chronic rhinitis (grasses, weeds, ragweed, trees, molds, cat, dog, cockroach, dust mite) - Continue with Dymista two sprays per nostril once daily. - You can increase up to two sprays per nostril twice daily on the worst days.  - Continue with Xyzal 5mg  daily.  - Consider starting allergy shots if there is no improvement with the above.  2. Cough - improving over the last week  - Spirometry was normal at the last visit.  - Continue with the albuterol as needed.  - We can hold off on controllers at this time.   3. Reflux - Continue with the Dexilant 30mg  every morning. - Continue with Zantac but increase to 300mg  at night.  - Take this off as tolerated if you are worried about bone mineralization during your recovery.   4. Return in about 6 months (around 02/28/2017).   Subjective:   [name] is a 66 y.o. female presenting today for follow up of  Chief Complaint  Patient presents with  . Allergic Rhinitis     Amy BarreDeborah F Sopher has a history of the following: Patient Active Problem List   Diagnosis Date Noted  . Closed fracture of right tibial plateau 08/20/2016    History obtained from: chart review and patient.  Amy Barreeborah F Annas was referred by Assunta FoundGolding, John, MD.     [name] is a 66 y.o. female presenting for a follow up visit. She was last seen in February 2018 as a new patient. At that time she had testing that showed positives to grasses, weeds, ragweed, trees, molds, cats, dog, cockroach, and dust mite. She had a cough with a normal spirometry, and I felt that the cough was more related to reactive airway dysfunction syndrome. I maximized treatment with Dexilant 30mg  daily as well as continuing the Zantac. We  started her on Dymista two sprays per nostril daily as well as Xyzal 5mg  daily.    Since the last visit, [name] has done fairly well. However, her clinical picture has been added by the home renovation project that is ongoing. They are completely renovating their kitchen, which I asked her to a lot of dust. The dust has seemed to irritate her coughing as well as sinuses. She feels that the Xyzal in the Dymista do take the edge off of her symptoms. She is not interested in allergy shots, as she would rather try the medications at a time when her house is not in such disarray.  She recently injured her right knee after trying to move a so far off of a truck. She is currently in an immobile cast and is not allowed to bend her right knee at all. She has stopped her proton pump inhibitor in the interim because of concern for decreased bone mineralization. Her cough and the most part has remained stable. But again, she wants to wait for the chaos in her house to subside before deciding whether to start asthma medications empirically.  Otherwise, there have been no changes to her past medical history, surgical history, family history, or social history.    Review of Systems: a 14-point review of systems is pertinent for what is mentioned in HPI.  Otherwise, all other systems were negative. Constitutional: negative other than that listed in the HPI Eyes:  negative other than that listed in the HPI Ears, nose, mouth, throat, and face: negative other than that listed in the HPI Respiratory: negative other than that listed in the HPI Cardiovascular: negative other than that listed in the HPI Gastrointestinal: negative other than that listed in the HPI Genitourinary: negative other than that listed in the HPI Integument: negative other than that listed in the HPI Hematologic: negative other than that listed in the HPI Musculoskeletal: negative other than that listed in the HPI Neurological: negative other  than that listed in the HPI Allergy/Immunologic: negative other than that listed in the HPI    Objective:   Blood pressure 110/80, pulse 69, temperature 97.4 F (36.3 C), temperature source Oral, resp. rate 17, SpO2 96 %. There is no height or weight on file to calculate BMI.   Physical Exam:  General: Alert, interactive, in no acute distress. Pleasant female.  Eyes: No conjunctival injection present on the right, No conjunctival injection present on the left, PERRL bilaterally, No discharge on the right, No discharge on the left and No Horner-Trantas dots present Ears: Right TM pearly gray with normal light reflex, Left TM pearly gray with normal light reflex, Right TM intact without perforation and Left TM intact without perforation.  Nose/Throat: External nose within normal limits and septum midline, turbinates markedly edematous with clear discharge, post-pharynx erythematous with cobblestoning in the posterior oropharynx. Tonsils 2+ without exudates Neck: Supple without thyromegaly. Lungs: Clear to auscultation without wheezing, rhonchi or rales. No increased work of breathing. CV: Normal S1/S2, no murmurs. Capillary refill <2 seconds.  Skin: Warm and dry, without lesions or rashes. Neuro:   Grossly intact. No focal deficits appreciated. Responsive to questions.   Diagnostic studies: none    Malachi Bonds, MD [facility]

## 2016-08-28 NOTE — Patient Instructions (Addendum)
1. Chronic rhinitis (grasses, weeds, ragweed, trees, molds, cat, dog, cockroach, dust mite) - Continue with Dymista two sprays per nostril once daily. - You can increase up to two sprays per nostril twice daily on the worst days.  - Continue with Xyzal 5mg  daily.  - Consider starting allergy shots if there is no improvement with the above.  2. Cough - improving over the last week  - Spirometry was normal at the last visit.  - Continue with the albuterol as needed.  - We can hold off on controllers at this time.   3. Reflux - Continue with the Dexilant 30mg  every morning. - Continue with Zantac but increase to 300mg  at night.  - Take this off as tolerated if you are worried about bone mineralization during your recovery.   4. Return in about 6 months (around 02/28/2017).  Please inform us of any Emergency Department visits, hospitalizations, or changes in symptoms. Call us before going to the ED for breathing or allergy symptoms since we might be able to fit you in for a sick visit. Feel free to contact us anytime with any questions, problems, or concerns.  It was a pleasure to see you again today! Happy spring!     Websites that have reliable patient information: 1. American Academy of Asthma, Allergy, and Immunology: www.aaaai.org 2. Food Allergy Research and Education (FARE): foodallergy.org 3. Mothers of Asthmatics: http://www.asthmacommunitynetwork.org 4. American College of Allergy, Asthma, and Immunology: www.acaai.org

## 2016-09-12 ENCOUNTER — Ambulatory Visit (INDEPENDENT_AMBULATORY_CARE_PROVIDER_SITE_OTHER): Payer: 59

## 2016-09-12 ENCOUNTER — Ambulatory Visit (INDEPENDENT_AMBULATORY_CARE_PROVIDER_SITE_OTHER): Payer: 59 | Admitting: Family

## 2016-09-12 ENCOUNTER — Ambulatory Visit
Admission: RE | Admit: 2016-09-12 | Discharge: 2016-09-12 | Disposition: A | Discharge status: Home / Self Care | Payer: 59 | Source: Ambulatory Visit | Attending: Family | Admitting: Family

## 2016-09-12 ENCOUNTER — Encounter (INDEPENDENT_AMBULATORY_CARE_PROVIDER_SITE_OTHER): Payer: Self-pay | Admitting: Family

## 2016-09-12 VITALS — Ht 63.0 in | Wt 165.0 lb

## 2016-09-12 DIAGNOSIS — S82141D Displaced bicondylar fracture of right tibia, subsequent encounter for closed fracture with routine healing: Secondary | ICD-10-CM

## 2016-09-12 NOTE — Progress Notes (Signed)
Office Visit Note   Patient: [name]           Date of Birth: May 23, 1950           MRN: [id number] Visit Date: 09/12/2016              Requested by: Assunta FoundGolding, John, MD [address] [place], [place] [address] PCP: Assunta FoundGolding, John, MD  Chief Complaint  Patient presents with  . Right Leg - Follow-up    DOI 08/16/16 tib-plat fx      HPI: The patient is a 66 year old woman who is seen in follow up for a tibial plateau fracture on the right. Was moving a couch over the weekend. She was taking it down on the truck bed with her husband. The couch fell down on top of her. States she twisted her knee out laterally.   Was initially seen and evaluated in the emergency department. Sustained a tibial plateau fracture with depression on the right. Is in a bledsoe today, using crutches for nonweightbearing.  No pain with knee extended. No concerns today.   Assessment & Plan: Visit Diagnoses:  1. Closed fracture of right tibial plateau with routine healing, subsequent encounter     Plan: Discussed case and radiographs with Dr. August Saucerean. Will have her have a CT today to eval for possible surgical repair.  CT resulted: discussed with Dr. August Saucerean who is in agreement. Will continue with nonoperative treatment.  Patient called: May discontinue Bledsoe. Will have her begin gentle rom. Remain nonweight bearing for 2 more weeks. Follow up in office in 2 weeks with Dr. August Saucerean.   Follow-Up Instructions: No Follow-up on file.   Ortho Exam  Patient is alert, oriented, no adenopathy, well-dressed, normal affect, normal respiratory effort. Right knee with minimal swelling. MCL And LCL nontender. No laxity. Strong DP pulse.   Imaging: Ct Knee Right Wo Contrast  Result Date: 09/12/2016 CLINICAL DATA:  Left knee fracture 08/16/2016.  Status post fall. EXAM: CT OF THE LEFT KNEE WITHOUT CONTRAST TECHNIQUE: Multidetector CT imaging of the LEFT knee was performed according to the standard protocol.  Multiplanar CT image reconstructions were also generated. COMPARISON:  None. FINDINGS: Bones/Joint/Cartilage Isolated lateral tibial plateau fracture with 2-3 mm of depression. No other fracture or dislocation. Moderate joint effusion. Normal alignment. No aggressive lytic or sclerotic osseous lesion. Mild osteoarthritis of the patellofemoral compartment. Mild osteoarthritis of the proximal tibiofibular joint. Ligaments Ligaments are suboptimally evaluated by CT. ACL and PCL are grossly intact. MCL and LCL are grossly intact. Muscles and Tendons Muscles are normal. No muscle atrophy. No intramuscular fluid collection or hematoma. Soft tissue No fluid collection or hematoma.  No soft tissue mass. IMPRESSION: 1. Isolated lateral tibial plateau fracture with 2-3 mm of depression (Schatzker III). Electronically Signed   By: Elige KoHetal  Patel   On: 09/12/2016 12:44    Labs: No results found for: HGBA1C, ESRSEDRATE, CRP, LABURIC, REPTSTATUS, GRAMSTAIN, CULT, LABORGA  Orders:  Orders Placed This Encounter  Procedures  . XR Knee 1-2 Views Right  . CT KNEE RIGHT WO CONTRAST   No orders of the defined types were placed in this encounter.    Procedures: No procedures performed  Clinical Data: No additional findings.  ROS:  All other systems negative, except as noted in the HPI. Review of Systems  Constitutional: Negative for chills and fever.  Musculoskeletal: Positive for arthralgias, gait problem and joint swelling.  Skin: Positive for color change.    Objective: Vital Signs: Ht  5\' 3"  (1.6 m)   Wt 165 lb (74.8 kg)   BMI 29.23 kg/m   Specialty Comments:  No specialty comments available.  PMFS History: Patient Active Problem List   Diagnosis Date Noted  . Closed fracture of right tibial plateau 08/20/2016   Past Medical History:  Diagnosis Date  . Constipation   . GERD (gastroesophageal reflux disease)   . Hemorrhoids   . Migraine headache   . Urticaria    Deodorants    Family  History  Problem Relation Age of Onset  . Allergic rhinitis Mother        medications  . COPD Mother   . Urticaria Mother   . Allergic rhinitis Father   . Asthma Father   . Urticaria Father   . Asthma Brother   . Psoriasis Son   . Psoriasis Daughter     Past Surgical History:  Procedure Laterality Date  . ADENOIDECTOMY    . BREAST LUMPECTOMY Right   . ENDOMETRIAL ABLATION    . STAPEDES SURGERY Right   . TONSILLECTOMY     Social History   Occupational History  . Not on file.   Social History Main Topics  . Smoking status: Passive Smoke Exposure - Never Smoker    Types: Cigars  . Smokeless tobacco: Never Used     Comment: spouse smokes, but not in home  . Alcohol use Yes  . Drug use: No  . Sexual activity: Not on file

## 2016-09-26 ENCOUNTER — Encounter (INDEPENDENT_AMBULATORY_CARE_PROVIDER_SITE_OTHER): Payer: Self-pay | Admitting: Orthopedic Surgery

## 2016-09-26 ENCOUNTER — Ambulatory Visit (INDEPENDENT_AMBULATORY_CARE_PROVIDER_SITE_OTHER): Payer: 59 | Admitting: Orthopedic Surgery

## 2016-09-26 DIAGNOSIS — S82141D Displaced bicondylar fracture of right tibia, subsequent encounter for closed fracture with routine healing: Secondary | ICD-10-CM

## 2016-09-26 NOTE — Progress Notes (Signed)
   Post-Op Visit Note   Patient: Keturah BarreDeborah F Donaway           Date of Birth: 09-03-1950           MRN: [id number] Visit Date: 09/26/2016 PCP: Assunta FoundGolding, John, MD   Assessment & Plan:  Chief Complaint:  Chief Complaint  Patient presents with  . Right Knee - Follow-up, Fracture   Visit Diagnoses:  1. Closed fracture of right tibial plateau with routine healing, subsequent encounter     Plan: Stanton KidneyDebra is a patient who is now about 6 weeks out right tibial plateau fracture.  She's been doing well with nonweightbearing.  On exam she has excellent range of motion and no real varus or valgus instability at 0 and 30 which is asymmetric for more than 10 side-to-side difference.  She is having some calf pain.  She has not been on any type of DVT prophylactic agents since she has been nonweightbearing.  I'll like to get an ultrasound rule out DVT short of that lamina put her in a one-week transitional weightbearing.  Where she is 50% weightbearing on the right leg 50% through crutches then after that she can be weightbearing as tolerated.  She is retired she cares for her elderly parents.  I'll see her back in 4 weeks for clinical recheck and final x-rays.  Follow-Up Instructions: Return in about 4 weeks (around 10/24/2016).   Orders:  No orders of the defined types were placed in this encounter.  No orders of the defined types were placed in this encounter.   Imaging: No results found.  PMFS History: Patient Active Problem List   Diagnosis Date Noted  . Closed fracture of right tibial plateau 08/20/2016   Past Medical History:  Diagnosis Date  . Constipation   . GERD (gastroesophageal reflux disease)   . Hemorrhoids   . Migraine headache   . Urticaria    Deodorants    Family History  Problem Relation Age of Onset  . Allergic rhinitis Mother        medications  . COPD Mother   . Urticaria Mother   . Allergic rhinitis Father   . Asthma Father   . Urticaria Father   . Asthma  Brother   . Psoriasis Son   . Psoriasis Daughter     Past Surgical History:  Procedure Laterality Date  . ADENOIDECTOMY    . BREAST LUMPECTOMY Right   . ENDOMETRIAL ABLATION    . STAPEDES SURGERY Right   . TONSILLECTOMY     Social History   Occupational History  . Not on file.   Social History Main Topics  . Smoking status: Passive Smoke Exposure - Never Smoker    Types: Cigars  . Smokeless tobacco: Never Used     Comment: spouse smokes, but not in home  . Alcohol use Yes  . Drug use: No  . Sexual activity: Not on file

## 2016-09-27 ENCOUNTER — Ambulatory Visit (HOSPITAL_COMMUNITY)
Admission: RE | Admit: 2016-09-27 | Discharge: 2016-09-27 | Disposition: A | Discharge status: Home / Self Care | Payer: 59 | Source: Ambulatory Visit | Attending: Orthopedic Surgery | Admitting: Orthopedic Surgery

## 2016-09-27 DIAGNOSIS — M7989 Other specified soft tissue disorders: Secondary | ICD-10-CM | POA: Diagnosis not present

## 2016-09-27 DIAGNOSIS — S82141D Displaced bicondylar fracture of right tibia, subsequent encounter for closed fracture with routine healing: Secondary | ICD-10-CM | POA: Insufficient documentation

## 2016-10-25 ENCOUNTER — Ambulatory Visit (INDEPENDENT_AMBULATORY_CARE_PROVIDER_SITE_OTHER): Payer: 59 | Admitting: Orthopedic Surgery

## 2016-10-25 ENCOUNTER — Ambulatory Visit (INDEPENDENT_AMBULATORY_CARE_PROVIDER_SITE_OTHER): Payer: 59

## 2016-10-25 ENCOUNTER — Encounter (INDEPENDENT_AMBULATORY_CARE_PROVIDER_SITE_OTHER): Payer: Self-pay | Admitting: Orthopedic Surgery

## 2016-10-25 DIAGNOSIS — S82141D Displaced bicondylar fracture of right tibia, subsequent encounter for closed fracture with routine healing: Secondary | ICD-10-CM

## 2016-10-26 NOTE — Progress Notes (Signed)
   Post-Op Visit Note   Patient: [name]           Date of Birth: Apr 24, 1950           MRN: [id number] Visit Date: 10/25/2016 PCP: Assunta FoundGolding, John, MD   Assessment & Plan:  Chief Complaint:  Chief Complaint  Patient presents with  . Right Knee - Follow-up, Fracture   Visit Diagnoses:  1. Closed fracture of right tibial plateau with routine healing, subsequent encounter     Plan: [name] is a 66 year old patient right tibial plateau fracture.  Doing well.  Date of injury 08/16/2016.  On exam normal gait and alignment doing quad strength no effusion no instability to varus valgus stress at 0 radiographs show no change in fracture alignment plan is that the patient is doing well following her injury she is weightbearing as tolerated.  She's going up and down stairs reasonably well.  She's not taking any medications.  I will see her back as needed.  Follow-Up Instructions: Return if symptoms worsen or fail to improve.   Orders:  Orders Placed This Encounter  Procedures  . XR Knee 1-2 Views Right   No orders of the defined types were placed in this encounter.   Imaging: No results found.  PMFS History: Patient Active Problem List   Diagnosis Date Noted  . Closed fracture of right tibial plateau 08/20/2016   Past Medical History:  Diagnosis Date  . Constipation   . GERD (gastroesophageal reflux disease)   . Hemorrhoids   . Migraine headache   . Urticaria    Deodorants    Family History  Problem Relation Age of Onset  . Allergic rhinitis Mother        medications  . COPD Mother   . Urticaria Mother   . Allergic rhinitis Father   . Asthma Father   . Urticaria Father   . Asthma Brother   . Psoriasis Son   . Psoriasis Daughter     Past Surgical History:  Procedure Laterality Date  . ADENOIDECTOMY    . BREAST LUMPECTOMY Right   . ENDOMETRIAL ABLATION    . STAPEDES SURGERY Right   . TONSILLECTOMY     Social History   Occupational History  . Not on  file.   Social History Main Topics  . Smoking status: Passive Smoke Exposure - Never Smoker    Types: Cigars  . Smokeless tobacco: Never Used     Comment: spouse smokes, but not in home  . Alcohol use Yes  . Drug use: No  . Sexual activity: Not on file

## 2017-03-12 ENCOUNTER — Encounter: Payer: Self-pay | Admitting: Allergy & Immunology

## 2017-03-12 ENCOUNTER — Ambulatory Visit: Payer: 59 | Admitting: Allergy & Immunology

## 2017-03-12 VITALS — BP 112/70 | HR 68 | Resp 16

## 2017-03-12 DIAGNOSIS — J302 Other seasonal allergic rhinitis: Secondary | ICD-10-CM | POA: Diagnosis not present

## 2017-03-12 DIAGNOSIS — K219 Gastro-esophageal reflux disease without esophagitis: Secondary | ICD-10-CM | POA: Diagnosis not present

## 2017-03-12 DIAGNOSIS — J3089 Other allergic rhinitis: Secondary | ICD-10-CM

## 2017-03-12 DIAGNOSIS — J683 Other acute and subacute respiratory conditions due to chemicals, gases, fumes and vapors: Secondary | ICD-10-CM

## 2017-03-12 MED ORDER — EPINEPHRINE 0.3 MG/0.3ML IJ SOAJ: 0.3000 mg | Freq: Once | INTRAMUSCULAR | 1 refills | Status: AC

## 2017-03-12 NOTE — Progress Notes (Signed)
FOLLOW UP  Date of Service/Encounter:  03/12/17   Assessment:   Reactive airways dysfunction syndrome  Seasonal and perennial allergic rhinitis (grasses, weeds, ragweed, trees, molds, cat, dog, cockroach, dust mite)  Gastroesophageal reflux disease  Plan/Recommendations:   1. Chronic rhinitis (grasses, weeds, ragweed, trees, molds, cat, dog, cockroach, dust mite) - Continue with Dymista two sprays per nostril once daily. - You can increase up to two sprays per nostril twice daily on the worst days.  - Continue with Xyzal 5mg  daily.  - We will go ahead and order the allergy shots.  - Consent signed today.  2. Cough - likely reactive airway disease - Continue to avoid triggers. - We had discussed at previous appointments starting controller medications, but since using her albuterol alone results in these coughing episode, I am not convinced that this will help with her overall clinical picture.  - Reflux could be contributing to her symptoms, since she has stopped her Dexilant, but she tells me today that her cough has not changed since stopping the Dexilant (although her GERD symptoms have worsened).   3. Reflux - Continue with Zantac but increase to 300mg  at night.   4. Return in about 6 months (around 09/10/2017).  Subjective:   [name] is a 66 y.o. female presenting today for follow up of  Chief Complaint  Patient presents with  . Allergic Rhinitis     Wants to discuss ITX  . Cough    Nonproductive/worse in early am and evening  . Gastroesophageal Reflux    Increased sx off and on.  Off Dexilant    [name] has a history of the following: Patient Active Problem List   Diagnosis Date Noted  . Seasonal and perennial allergic rhinitis 03/12/2017  . Reactive airways dysfunction syndrome 03/12/2017  . Gastroesophageal reflux disease 03/12/2017  . Closed fracture of right tibial plateau 08/20/2016    History obtained from: chart review and  patient.  [name] Primary Care Provider is Assunta FoundGolding, John, MD.     Amy PoundDeborah is a 66 y.o. female presenting for a follow up visit. She was last seen in May 2018. At that time, she was doing well. She has a history of sensitizations to grasses, weeds, ragweed, trees, molds, cat, dog, cockroach, dust mite. She was continued on Dymista two sprays per nostril daily as well as Xyzal 5mg  daily. She continued to have problems with a chronic cough, which ws triggered by various fumes and exposures. Reflux was controlled with Dexilant 30mg  daily as well as Zantac at night.   Since the last visit, she has done well. She continues to have the cough. She did stop the Dexilant and is taking only Pepcid and Zantac; the reflux did get worse. She was worried about the Dexilant because she has had two breaks. She does not want to "soften her bones". She has used Ventolin in the past, but she is unsure whether this is actually helping. Any kind of fumes tend to make the coughing worse, including using spices in the kitchen.   She is interested in starting allergy shots. She is on the Dymista, which does provide some relief. However, after using it she will develop a coughing spell that calms down after a few minutes. It does calm her nose down, however. She recently completed two weeks of Mucinex-D due to sinus congestion.   Otherwise, there have been no changes to her past medical history, surgical history, family history, or social history.  They did recently put their dog down, who was ten years old. She is also in the process of taking care of her elderly parents. Her mother is [id number] and has severe dementia, which has proved difficult to manage.     Review of Systems: a 14-point review of systems is pertinent for what is mentioned in HPI.  Otherwise, all other systems were negative. Constitutional: negative other than that listed in the HPI Eyes: negative other than that listed in the HPI Ears, nose, mouth,  throat, and face: negative other than that listed in the HPI Respiratory: negative other than that listed in the HPI Cardiovascular: negative other than that listed in the HPI Gastrointestinal: negative other than that listed in the HPI Genitourinary: negative other than that listed in the HPI Integument: negative other than that listed in the HPI Hematologic: negative other than that listed in the HPI Musculoskeletal: negative other than that listed in the HPI Neurological: negative other than that listed in the HPI Allergy/Immunologic: negative other than that listed in the HPI    Objective:   Blood pressure 112/70, pulse 68, resp. rate 16, SpO2 97 %. There is no height or weight on file to calculate BMI.   Physical Exam:  General: Alert, interactive, in no acute distress. Pleasant female.  Eyes: No conjunctival injection bilaterally, no discharge on the right, no discharge on the left and no Horner-Trantas dots present. PERRL bilaterally. EOMI without pain. No photophobia.  Ears: Right TM pearly gray with normal light reflex, Left TM pearly gray with normal light reflex, Right TM intact without perforation and Left TM intact without perforation.  Nose/Throat: External nose within normal limits and septum midline. Turbinates edematous and pale with clear discharge. Posterior oropharynx erythematous without cobblestoning in the posterior oropharynx. Tonsils 2+ without exudates.  Tongue without thrush. Adenopathy: no enlarged lymph nodes appreciated in the anterior cervical, occipital, axillary, epitrochlear, inguinal, or popliteal regions. Lungs: Clear to auscultation without wheezing, rhonchi or rales. No increased work of breathing. CV: Normal S1/S2. No murmurs. Capillary refill <2 seconds.  Skin: Warm and dry, without lesions or rashes. Neuro:   Grossly intact. No focal deficits appreciated. Responsive to questions.  Diagnostic studies: none     Malachi BondsJoel Eliannah Hinde, MD  [facility]

## 2017-03-12 NOTE — Patient Instructions (Addendum)
1. Chronic rhinitis (grasses, weeds, ragweed, trees, molds, cat, dog, cockroach, dust mite) - Continue with Dymista two sprays per nostril once daily. - You can increase up to two sprays per nostril twice daily on the worst days.  - Continue with Xyzal 5mg  daily.  - We will go ahead and order the allergy shots.  - Consent signed today.  2. Cough - likely reactive airway disease - Continue to avoid triggers.  3. Reflux - Continue with Zantac but increase to 300mg  at night.  - Take this off as tolerated if you are worried about bone mineralization during your recovery.   4. Return in about 6 months (around 09/10/2017).   Please inform us of any Emergency Department visits, hospitalizations, or changes in symptoms. Call us before going to the ED for breathing or allergy symptoms since we might be able to fit you in for a sick visit. Feel free to contact us anytime with any questions, problems, or concerns.  It was a pleasure to see you again today! Enjoy the holiday season!  Websites that have reliable patient information: 1. American Academy of Asthma, Allergy, and Immunology: www.aaaai.org 2. Food Allergy Research and Education (FARE): foodallergy.org 3. Mothers of Asthmatics: http://www.asthmacommunitynetwork.org 4. American College of Allergy, Asthma, and Immunology: www.acaai.org

## 2017-03-13 DIAGNOSIS — J302 Other seasonal allergic rhinitis: Secondary | ICD-10-CM | POA: Diagnosis not present

## 2017-03-13 NOTE — Progress Notes (Signed)
VIALS EXP 03-15-18 

## 2017-03-14 DIAGNOSIS — J3089 Other allergic rhinitis: Secondary | ICD-10-CM | POA: Diagnosis not present

## 2017-03-26 ENCOUNTER — Ambulatory Visit (INDEPENDENT_AMBULATORY_CARE_PROVIDER_SITE_OTHER): Payer: 59

## 2017-03-26 DIAGNOSIS — J309 Allergic rhinitis, unspecified: Secondary | ICD-10-CM | POA: Diagnosis not present

## 2017-03-26 NOTE — Progress Notes (Signed)
Immunotherapy   Patient Details  Name: Amy BarreDeborah F Rich MRN: [id number] Date of Birth: 1951-01-30  03/26/2017  [name] started injections for  grass,weed,tree,cat,dog,mold,cr,dm, Following schedule: B  Frequency:1 time per week Epi-Pen:Epi-Pen Available  Consent signed and patient instructions given.   Riley LamCatina Nikolaos Maddocks 03/26/2017, 1:29 PM

## 2017-04-23 ENCOUNTER — Ambulatory Visit (INDEPENDENT_AMBULATORY_CARE_PROVIDER_SITE_OTHER): Payer: Medicare Other

## 2017-04-23 DIAGNOSIS — J309 Allergic rhinitis, unspecified: Secondary | ICD-10-CM

## 2017-04-30 ENCOUNTER — Ambulatory Visit (INDEPENDENT_AMBULATORY_CARE_PROVIDER_SITE_OTHER): Payer: Medicare Other

## 2017-04-30 DIAGNOSIS — J309 Allergic rhinitis, unspecified: Secondary | ICD-10-CM

## 2017-05-06 DIAGNOSIS — Z1231 Encounter for screening mammogram for malignant neoplasm of breast: Secondary | ICD-10-CM | POA: Diagnosis not present

## 2017-05-07 ENCOUNTER — Ambulatory Visit (INDEPENDENT_AMBULATORY_CARE_PROVIDER_SITE_OTHER): Payer: Medicare Other | Admitting: *Deleted

## 2017-05-07 DIAGNOSIS — J309 Allergic rhinitis, unspecified: Secondary | ICD-10-CM

## 2017-05-10 DIAGNOSIS — N6321 Unspecified lump in the left breast, upper outer quadrant: Secondary | ICD-10-CM | POA: Diagnosis not present

## 2017-05-10 DIAGNOSIS — N6012 Diffuse cystic mastopathy of left breast: Secondary | ICD-10-CM | POA: Diagnosis not present

## 2017-05-10 DIAGNOSIS — Z1389 Encounter for screening for other disorder: Secondary | ICD-10-CM | POA: Diagnosis not present

## 2017-05-10 DIAGNOSIS — R109 Unspecified abdominal pain: Secondary | ICD-10-CM | POA: Diagnosis not present

## 2017-05-10 DIAGNOSIS — N6322 Unspecified lump in the left breast, upper inner quadrant: Secondary | ICD-10-CM | POA: Diagnosis not present

## 2017-05-10 DIAGNOSIS — K219 Gastro-esophageal reflux disease without esophagitis: Secondary | ICD-10-CM | POA: Diagnosis not present

## 2017-05-10 DIAGNOSIS — Z79899 Other long term (current) drug therapy: Secondary | ICD-10-CM | POA: Diagnosis not present

## 2017-05-11 DIAGNOSIS — E748 Other specified disorders of carbohydrate metabolism: Secondary | ICD-10-CM | POA: Diagnosis not present

## 2017-05-14 ENCOUNTER — Ambulatory Visit (INDEPENDENT_AMBULATORY_CARE_PROVIDER_SITE_OTHER): Payer: Medicare Other

## 2017-05-14 DIAGNOSIS — J309 Allergic rhinitis, unspecified: Secondary | ICD-10-CM

## 2017-05-21 ENCOUNTER — Ambulatory Visit (INDEPENDENT_AMBULATORY_CARE_PROVIDER_SITE_OTHER): Payer: Medicare Other

## 2017-05-21 DIAGNOSIS — J309 Allergic rhinitis, unspecified: Secondary | ICD-10-CM | POA: Diagnosis not present

## 2017-05-28 ENCOUNTER — Ambulatory Visit: Payer: Medicare Other | Admitting: Allergy & Immunology

## 2017-05-28 ENCOUNTER — Encounter: Payer: Self-pay | Admitting: Allergy & Immunology

## 2017-05-28 VITALS — BP 118/70 | HR 84 | Temp 97.6°F | Resp 19

## 2017-05-28 DIAGNOSIS — H66001 Acute suppurative otitis media without spontaneous rupture of ear drum, right ear: Secondary | ICD-10-CM

## 2017-05-28 DIAGNOSIS — J683 Other acute and subacute respiratory conditions due to chemicals, gases, fumes and vapors: Secondary | ICD-10-CM | POA: Diagnosis not present

## 2017-05-28 DIAGNOSIS — J302 Other seasonal allergic rhinitis: Secondary | ICD-10-CM | POA: Diagnosis not present

## 2017-05-28 DIAGNOSIS — J0100 Acute maxillary sinusitis, unspecified: Secondary | ICD-10-CM | POA: Diagnosis not present

## 2017-05-28 DIAGNOSIS — J3089 Other allergic rhinitis: Secondary | ICD-10-CM | POA: Diagnosis not present

## 2017-05-28 DIAGNOSIS — K219 Gastro-esophageal reflux disease without esophagitis: Secondary | ICD-10-CM

## 2017-05-28 MED ORDER — AMOXICILLIN-POT CLAVULANATE 875-125 MG PO TABS: 1.0000 | ORAL_TABLET | Freq: Two times a day (BID) | ORAL | 0 refills | Status: AC

## 2017-05-28 NOTE — Patient Instructions (Addendum)
1. Acute sinusitis with a right otitis media - With your current symptoms and time course, antibiotics are needed: Augmentin 875mg  twice daily for 14 days  - Start the prednisone pack provided to help control inflammation.  - Add on nasal saline spray (i.e., Simply Saline) or nasal saline lavage (i.e., NeilMed) as needed prior to medicated nasal sprays. - Continue with guaifenesin [phone] mg (Mucinex) twice daily as needed for mucous thinning with adequate hydration to help it work.   - If the facial pain continues after two more weeks, I would recommend seeing your PCP about this.   2. Chronic rhinitis (grasses, weeds, ragweed, trees, molds, cat, dog, cockroach, dust mite) - Continue with Dymista two sprays per nostril once daily. - Continue with Xyzal 5mg  daily (stop for the next week while you are getting over your sinus infection) - Continue with allergy shots at the same schedule.   3. Cough - likely reactive airway disease - Continue to avoid triggers.  4. Reflux - Continue with Zantac but increase to 300mg  at night.  - Take this off as tolerated if you are worried about bone mineralization during your recovery.   5. Return in about 3 months (around 08/25/2017).   Please inform us of any Emergency Department visits, hospitalizations, or changes in symptoms. Call us before going to the ED for breathing or allergy symptoms since we might be able to fit you in for a sick visit. Feel free to contact us anytime with any questions, problems, or concerns.  It was a pleasure to see you again today! Happy Valentine's Day!   Websites that have reliable patient information: 1. American Academy of Asthma, Allergy, and Immunology: www.aaaai.org 2. Food Allergy Research and Education (FARE): foodallergy.org 3. Mothers of Asthmatics: http://www.asthmacommunitynetwork.org 4. American College of Allergy, Asthma, and Immunology: www.acaai.org
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
## 2017-06-11 ENCOUNTER — Ambulatory Visit (INDEPENDENT_AMBULATORY_CARE_PROVIDER_SITE_OTHER): Payer: Medicare Other | Admitting: *Deleted

## 2017-06-11 DIAGNOSIS — J309 Allergic rhinitis, unspecified: Secondary | ICD-10-CM | POA: Diagnosis not present

## 2017-06-18 ENCOUNTER — Ambulatory Visit (INDEPENDENT_AMBULATORY_CARE_PROVIDER_SITE_OTHER): Payer: Medicare Other | Admitting: *Deleted

## 2017-06-18 DIAGNOSIS — J309 Allergic rhinitis, unspecified: Secondary | ICD-10-CM | POA: Diagnosis not present

## 2017-06-25 ENCOUNTER — Ambulatory Visit: Payer: Self-pay

## 2017-06-25 ENCOUNTER — Telehealth: Payer: Self-pay

## 2017-06-25 MED ORDER — AZELASTINE-FLUTICASONE 137-50 MCG/ACT NA SUSP: 2.0000 | Freq: Every day | NASAL | 5 refills | Status: DC

## 2017-06-25 MED ORDER — LEVOCETIRIZINE DIHYDROCHLORIDE 5 MG PO TABS: 5.0000 mg | ORAL_TABLET | Freq: Every evening | ORAL | 5 refills | Status: DC

## 2017-06-25 MED ORDER — RANITIDINE HCL 150 MG PO TABS: 300.0000 mg | ORAL_TABLET | Freq: Every day | ORAL | 5 refills | Status: DC

## 2017-06-25 NOTE — Progress Notes (Signed)
Injection not given due to patient being sick.

## 2017-06-25 NOTE — Telephone Encounter (Signed)
Received fax for refill for Dymista, Ranitidine and Levocetirizine. Patient call in today to get her injection and told Kayla that pharmacy was sending over refill request and Dorathy DaftKayla went ahead and sent them in.

## 2017-07-02 ENCOUNTER — Ambulatory Visit (INDEPENDENT_AMBULATORY_CARE_PROVIDER_SITE_OTHER): Payer: Medicare Other | Admitting: *Deleted

## 2017-07-02 DIAGNOSIS — J309 Allergic rhinitis, unspecified: Secondary | ICD-10-CM

## 2017-07-09 ENCOUNTER — Ambulatory Visit (INDEPENDENT_AMBULATORY_CARE_PROVIDER_SITE_OTHER): Payer: Medicare Other

## 2017-07-09 DIAGNOSIS — J309 Allergic rhinitis, unspecified: Secondary | ICD-10-CM | POA: Diagnosis not present

## 2017-07-23 DIAGNOSIS — L239 Allergic contact dermatitis, unspecified cause: Secondary | ICD-10-CM | POA: Diagnosis not present

## 2017-07-23 DIAGNOSIS — Z1389 Encounter for screening for other disorder: Secondary | ICD-10-CM | POA: Diagnosis not present

## 2017-07-30 ENCOUNTER — Ambulatory Visit (INDEPENDENT_AMBULATORY_CARE_PROVIDER_SITE_OTHER): Payer: Medicare Other

## 2017-07-30 DIAGNOSIS — J309 Allergic rhinitis, unspecified: Secondary | ICD-10-CM | POA: Diagnosis not present

## 2017-08-06 ENCOUNTER — Ambulatory Visit (INDEPENDENT_AMBULATORY_CARE_PROVIDER_SITE_OTHER): Payer: Medicare Other

## 2017-08-06 DIAGNOSIS — J309 Allergic rhinitis, unspecified: Secondary | ICD-10-CM

## 2017-08-13 ENCOUNTER — Ambulatory Visit (INDEPENDENT_AMBULATORY_CARE_PROVIDER_SITE_OTHER): Payer: Medicare Other | Admitting: *Deleted

## 2017-08-13 DIAGNOSIS — J309 Allergic rhinitis, unspecified: Secondary | ICD-10-CM

## 2017-08-20 ENCOUNTER — Ambulatory Visit (INDEPENDENT_AMBULATORY_CARE_PROVIDER_SITE_OTHER): Payer: Medicare Other | Admitting: *Deleted

## 2017-08-20 DIAGNOSIS — J309 Allergic rhinitis, unspecified: Secondary | ICD-10-CM | POA: Diagnosis not present

## 2017-08-27 ENCOUNTER — Ambulatory Visit (INDEPENDENT_AMBULATORY_CARE_PROVIDER_SITE_OTHER): Payer: Medicare Other

## 2017-08-27 DIAGNOSIS — J309 Allergic rhinitis, unspecified: Secondary | ICD-10-CM | POA: Diagnosis not present

## 2017-09-10 ENCOUNTER — Encounter: Payer: Self-pay | Admitting: Allergy & Immunology

## 2017-09-10 ENCOUNTER — Ambulatory Visit: Payer: 59 | Admitting: Allergy & Immunology

## 2017-09-10 VITALS — BP 122/70 | HR 57 | Temp 97.6°F | Resp 18 | Ht 63.5 in | Wt 155.4 lb

## 2017-09-10 DIAGNOSIS — J3089 Other allergic rhinitis: Secondary | ICD-10-CM | POA: Diagnosis not present

## 2017-09-10 DIAGNOSIS — J302 Other seasonal allergic rhinitis: Secondary | ICD-10-CM

## 2017-09-10 DIAGNOSIS — J683 Other acute and subacute respiratory conditions due to chemicals, gases, fumes and vapors: Secondary | ICD-10-CM

## 2017-09-10 DIAGNOSIS — K219 Gastro-esophageal reflux disease without esophagitis: Secondary | ICD-10-CM | POA: Diagnosis not present

## 2017-09-10 DIAGNOSIS — J309 Allergic rhinitis, unspecified: Secondary | ICD-10-CM

## 2017-09-10 MED ORDER — AZELASTINE HCL 0.1 % NA SOLN: 2.0000 | Freq: Every day | NASAL | 5 refills | Status: DC

## 2017-09-10 MED ORDER — FLUTICASONE PROPIONATE 50 MCG/ACT NA SUSP: 2.0000 | Freq: Every day | NASAL | 5 refills | Status: DC

## 2017-09-10 NOTE — Patient Instructions (Addendum)
1. Chronic rhinitis (grasses, weeds, ragweed, trees, molds, cat, dog, cockroach, dust mite) - We will change your Dymista into separate sprays: fluticasone 1-2 sprays per nostril daily + 1-2 sprays of azelastine nasal sprays per nostril daily - Continue with Xyzal 5mg  daily (stop for the next week while you are getting over your sinus infection) - Continue with allergy shots at the same schedule.   2. Cough - likely reactive airway dysfunction syndrome - Hopefully increasing the nasal sprays might help to control postnasal drip a little better.  3. Reflux - Continue with Zantac 150mg  nightly.   4. Return in about 6 months (around 03/12/2018).   Please inform us of any Emergency Department visits, hospitalizations, or changes in symptoms. Call us before going to the ED for breathing or allergy symptoms since we might be able to fit you in for a sick visit. Feel free to contact us anytime with any questions, problems, or concerns.  It was a pleasure to see you again today!  Websites that have reliable patient information: 1. American Academy of Asthma, Allergy, and Immunology: www.aaaai.org 2. Food Allergy Research and Education (FARE): foodallergy.org 3. Mothers of Asthmatics: http://www.asthmacommunitynetwork.org 4. American College of Allergy, Asthma, and Immunology: MissingWeapons.cawww.acaai.org   Make sure you are registered to vote!

## 2017-09-10 NOTE — Progress Notes (Signed)
FOLLOW UP  Date of Service/Encounter:  09/10/17   Assessment:   Reactive airways dysfunction syndrome  Seasonal and perennial allergic rhinitis(grasses, weeds, ragweed, trees, molds, cat, dog, cockroach, dust mite) - on allergen immunotherapy  Gastroesophageal reflux disease  Acute sinusitis with concurrent right acute otitis media   Plan/Recommendations:   1. Chronic rhinitis (grasses, weeds, ragweed, trees, molds, cat, dog, cockroach, dust mite) - We will change your Dymista into separate sprays: fluticasone 1-2 sprays per nostril daily + 1-2 sprays of azelastine nasal sprays per nostril daily - Continue with Xyzal 81m daily (stop for the next week while you are getting over your sinus infection) - Continue with allergy shots at the same schedule.   2. Cough - likely reactive airway dysfunction syndrome - Hopefully increasing the nasal sprays might help to control postnasal drip a little better.  3. Reflux - Continue with Zantac 1565mnightly.   4. Return in about 6 months (around 03/12/2018).  Subjective:   DeJOCELIN SCHUELKEs a [age].o. female presenting today for follow up of  Chief Complaint  Patient presents with  . Follow-up    DeDEVONA HOLMESas a history of the following: Patient Active Problem List   Diagnosis Date Noted  . Seasonal and perennial allergic rhinitis 03/12/2017  . Reactive airways dysfunction syndrome 03/12/2017  . Gastroesophageal reflux disease 03/12/2017  . Closed fracture of right tibial plateau 08/20/2016    History obtained from: chart review and patient.   [facility] Provider is GoSharilyn SitesMD.     DeOdessas a [age].o. female presenting for a follow up visit.  She was last seen in February 2019.  At that time, we diagnosed her with sinusitis and a right otitis media.  We started her on Augmentin for 2 weeks.  She has a history of perennial and seasonal allergic rhinitis.  We continue Dymista 2 sprays  per nostril daily as well as Xyzal 5 mg daily.  We discussed her diagnosis of reactive airway dysfunction syndrome and avoidance of triggers.  We continued her on Zantac but increase to 300 mg at night.  Since the last visit,  She has mostly done well. She does continue to cough on a daily basis. She thinks that it is mostly the pollen season. Overall her cough has improved since we first met, however. The pollen season seemed particularly bad this year. She did have to put her dog down recently, so this exposure is now limited.   She did increase her Zantac after the last visit, but she has actually lowered it since she has changed to a whole 30 diet.   DeEpsies on allergen immunotherapy. She receives two injections. Immunotherapy script #1 contains trees, weeds, grasses, cat and dog. She currently receives 0.4057mf the GOLD vial (1/10,000). Immunotherapy script #2 contains molds, dust mites and cockroach. She currently receives 0.57m51m the GOLD vial (1/10,000). She started shots December of 2018 and not yet reached maintenance.  She has had no reactions to her allergy shots.   Otherwise, there have been no changes to her past medical history, surgical history, family history, or social history.    Review of Systems: a 14-point review of systems is pertinent for what is mentioned in HPI.  Otherwise, all other systems were negative. Constitutional: negative other than that listed in the HPI Eyes: negative other than that listed in the HPI Ears, nose, mouth, throat, and face: negative other than that listed  in the HPI Respiratory: negative other than that listed in the HPI Cardiovascular: negative other than that listed in the HPI Gastrointestinal: negative other than that listed in the HPI Genitourinary: negative other than that listed in the HPI Integument: negative other than that listed in the HPI Hematologic: negative other than that listed in the HPI Musculoskeletal: negative other  than that listed in the HPI Neurological: negative other than that listed in the HPI Allergy/Immunologic: negative other than that listed in the HPI    Objective:   Blood pressure 122/70, pulse (!) 57, temperature 97.6 F (36.4 C), temperature source Oral, resp. rate 18, height 5' 3.5" (1.613 m), weight 155 lb 6.4 oz (70.5 kg), SpO2 98 %. Body mass index is 27.1 kg/m.   Physical Exam:  General: Alert, interactive, in no acute distress. Smiling and pleasant.  Eyes: No conjunctival injection bilaterally, no discharge on the right, no discharge on the left and no Horner-Trantas dots present. PERRL bilaterally. EOMI without pain. No photophobia.  Ears: Right TM pearly gray with normal light reflex, Left TM pearly gray with normal light reflex, Right TM intact without perforation and Left TM intact without perforation.  Nose/Throat: External nose within normal limits and septum midline. Turbinates edematous with clear discharge. Posterior oropharynx erythematous with cobblestoning in the posterior oropharynx. Tonsils 2+ without exudates.  Tongue without thrush and Geographic tongue present. Lungs: Clear to auscultation without wheezing, rhonchi or rales. No increased work of breathing. CV: Normal S1/S2. No murmurs. Capillary refill <2 seconds.  Skin: Warm and dry, without lesions or rashes. Neuro:   Grossly intact. No focal deficits appreciated. Responsive to questions.  Diagnostic studies: none      , MD  [facility]       

## 2017-09-17 ENCOUNTER — Ambulatory Visit (INDEPENDENT_AMBULATORY_CARE_PROVIDER_SITE_OTHER): Payer: Medicare Other

## 2017-09-17 DIAGNOSIS — R350 Frequency of micturition: Secondary | ICD-10-CM | POA: Diagnosis not present

## 2017-09-17 DIAGNOSIS — N342 Other urethritis: Secondary | ICD-10-CM | POA: Diagnosis not present

## 2017-09-17 DIAGNOSIS — J309 Allergic rhinitis, unspecified: Secondary | ICD-10-CM | POA: Diagnosis not present

## 2017-09-24 ENCOUNTER — Ambulatory Visit (INDEPENDENT_AMBULATORY_CARE_PROVIDER_SITE_OTHER): Payer: Medicare Other | Admitting: *Deleted

## 2017-09-24 DIAGNOSIS — J309 Allergic rhinitis, unspecified: Secondary | ICD-10-CM | POA: Diagnosis not present

## 2017-10-01 ENCOUNTER — Ambulatory Visit (INDEPENDENT_AMBULATORY_CARE_PROVIDER_SITE_OTHER): Payer: Medicare Other

## 2017-10-01 DIAGNOSIS — J309 Allergic rhinitis, unspecified: Secondary | ICD-10-CM

## 2017-10-02 DIAGNOSIS — N342 Other urethritis: Secondary | ICD-10-CM | POA: Diagnosis not present

## 2017-10-02 DIAGNOSIS — Z0001 Encounter for general adult medical examination with abnormal findings: Secondary | ICD-10-CM | POA: Diagnosis not present

## 2017-10-15 ENCOUNTER — Ambulatory Visit (INDEPENDENT_AMBULATORY_CARE_PROVIDER_SITE_OTHER): Payer: Medicare Other

## 2017-10-15 DIAGNOSIS — J309 Allergic rhinitis, unspecified: Secondary | ICD-10-CM | POA: Diagnosis not present

## 2017-10-22 ENCOUNTER — Ambulatory Visit (INDEPENDENT_AMBULATORY_CARE_PROVIDER_SITE_OTHER): Payer: Medicare Other

## 2017-10-22 DIAGNOSIS — J309 Allergic rhinitis, unspecified: Secondary | ICD-10-CM | POA: Diagnosis not present

## 2017-10-29 ENCOUNTER — Ambulatory Visit (INDEPENDENT_AMBULATORY_CARE_PROVIDER_SITE_OTHER): Payer: Medicare Other | Admitting: *Deleted

## 2017-10-29 DIAGNOSIS — J309 Allergic rhinitis, unspecified: Secondary | ICD-10-CM | POA: Diagnosis not present

## 2017-11-07 DIAGNOSIS — R928 Other abnormal and inconclusive findings on diagnostic imaging of breast: Secondary | ICD-10-CM | POA: Diagnosis not present

## 2017-11-12 ENCOUNTER — Ambulatory Visit (INDEPENDENT_AMBULATORY_CARE_PROVIDER_SITE_OTHER): Payer: Medicare Other | Admitting: *Deleted

## 2017-11-12 DIAGNOSIS — J309 Allergic rhinitis, unspecified: Secondary | ICD-10-CM

## 2017-11-18 DIAGNOSIS — Z1389 Encounter for screening for other disorder: Secondary | ICD-10-CM | POA: Diagnosis not present

## 2017-11-18 DIAGNOSIS — R339 Retention of urine, unspecified: Secondary | ICD-10-CM | POA: Diagnosis not present

## 2017-11-18 DIAGNOSIS — R3129 Other microscopic hematuria: Secondary | ICD-10-CM | POA: Diagnosis not present

## 2017-11-18 DIAGNOSIS — H9193 Unspecified hearing loss, bilateral: Secondary | ICD-10-CM | POA: Diagnosis not present

## 2017-11-19 ENCOUNTER — Ambulatory Visit (INDEPENDENT_AMBULATORY_CARE_PROVIDER_SITE_OTHER): Payer: Medicare Other | Admitting: *Deleted

## 2017-11-19 DIAGNOSIS — J309 Allergic rhinitis, unspecified: Secondary | ICD-10-CM | POA: Diagnosis not present

## 2017-11-26 ENCOUNTER — Ambulatory Visit (INDEPENDENT_AMBULATORY_CARE_PROVIDER_SITE_OTHER): Payer: Medicare Other

## 2017-11-26 DIAGNOSIS — J309 Allergic rhinitis, unspecified: Secondary | ICD-10-CM

## 2017-12-03 ENCOUNTER — Ambulatory Visit (INDEPENDENT_AMBULATORY_CARE_PROVIDER_SITE_OTHER): Payer: Medicare Other | Admitting: *Deleted

## 2017-12-03 DIAGNOSIS — J309 Allergic rhinitis, unspecified: Secondary | ICD-10-CM

## 2017-12-10 ENCOUNTER — Ambulatory Visit (INDEPENDENT_AMBULATORY_CARE_PROVIDER_SITE_OTHER): Payer: Medicare Other

## 2017-12-10 DIAGNOSIS — J309 Allergic rhinitis, unspecified: Secondary | ICD-10-CM | POA: Diagnosis not present

## 2017-12-24 ENCOUNTER — Ambulatory Visit (INDEPENDENT_AMBULATORY_CARE_PROVIDER_SITE_OTHER): Payer: Medicare Other | Admitting: *Deleted

## 2017-12-24 DIAGNOSIS — J309 Allergic rhinitis, unspecified: Secondary | ICD-10-CM | POA: Diagnosis not present

## 2017-12-31 ENCOUNTER — Ambulatory Visit (INDEPENDENT_AMBULATORY_CARE_PROVIDER_SITE_OTHER): Payer: Medicare Other

## 2017-12-31 DIAGNOSIS — J309 Allergic rhinitis, unspecified: Secondary | ICD-10-CM

## 2018-01-07 ENCOUNTER — Ambulatory Visit (INDEPENDENT_AMBULATORY_CARE_PROVIDER_SITE_OTHER): Payer: Medicare Other | Admitting: *Deleted

## 2018-01-07 DIAGNOSIS — J309 Allergic rhinitis, unspecified: Secondary | ICD-10-CM | POA: Diagnosis not present

## 2018-01-14 ENCOUNTER — Ambulatory Visit (INDEPENDENT_AMBULATORY_CARE_PROVIDER_SITE_OTHER): Payer: Medicare Other

## 2018-01-14 DIAGNOSIS — J309 Allergic rhinitis, unspecified: Secondary | ICD-10-CM

## 2018-01-14 MED ORDER — LEVOCETIRIZINE DIHYDROCHLORIDE 5 MG PO TABS: 5.0000 mg | ORAL_TABLET | Freq: Every evening | ORAL | 2 refills | Status: DC

## 2018-01-15 DIAGNOSIS — L820 Inflamed seborrheic keratosis: Secondary | ICD-10-CM | POA: Diagnosis not present

## 2018-01-15 DIAGNOSIS — T148XXA Other injury of unspecified body region, initial encounter: Secondary | ICD-10-CM | POA: Diagnosis not present

## 2018-01-15 DIAGNOSIS — L648 Other androgenic alopecia: Secondary | ICD-10-CM | POA: Diagnosis not present

## 2018-01-22 ENCOUNTER — Ambulatory Visit (INDEPENDENT_AMBULATORY_CARE_PROVIDER_SITE_OTHER): Payer: Medicare Other | Admitting: *Deleted

## 2018-01-22 DIAGNOSIS — J309 Allergic rhinitis, unspecified: Secondary | ICD-10-CM

## 2018-01-29 ENCOUNTER — Ambulatory Visit (INDEPENDENT_AMBULATORY_CARE_PROVIDER_SITE_OTHER): Payer: Medicare Other

## 2018-01-29 DIAGNOSIS — J309 Allergic rhinitis, unspecified: Secondary | ICD-10-CM

## 2018-02-03 DIAGNOSIS — J301 Allergic rhinitis due to pollen: Secondary | ICD-10-CM | POA: Diagnosis not present

## 2018-02-04 DIAGNOSIS — J301 Allergic rhinitis due to pollen: Secondary | ICD-10-CM

## 2018-02-05 ENCOUNTER — Ambulatory Visit (INDEPENDENT_AMBULATORY_CARE_PROVIDER_SITE_OTHER): Payer: Medicare Other | Admitting: *Deleted

## 2018-02-05 DIAGNOSIS — J309 Allergic rhinitis, unspecified: Secondary | ICD-10-CM | POA: Diagnosis not present

## 2018-02-06 DIAGNOSIS — J3089 Other allergic rhinitis: Secondary | ICD-10-CM | POA: Diagnosis not present

## 2018-02-12 ENCOUNTER — Ambulatory Visit (INDEPENDENT_AMBULATORY_CARE_PROVIDER_SITE_OTHER): Payer: Medicare Other | Admitting: *Deleted

## 2018-02-12 DIAGNOSIS — J309 Allergic rhinitis, unspecified: Secondary | ICD-10-CM | POA: Diagnosis not present

## 2018-02-26 ENCOUNTER — Ambulatory Visit (INDEPENDENT_AMBULATORY_CARE_PROVIDER_SITE_OTHER): Payer: Medicare Other | Admitting: *Deleted

## 2018-02-26 DIAGNOSIS — J309 Allergic rhinitis, unspecified: Secondary | ICD-10-CM | POA: Diagnosis not present

## 2018-03-05 DIAGNOSIS — Z1389 Encounter for screening for other disorder: Secondary | ICD-10-CM | POA: Diagnosis not present

## 2018-03-05 DIAGNOSIS — M5410 Radiculopathy, site unspecified: Secondary | ICD-10-CM | POA: Diagnosis not present

## 2018-03-05 DIAGNOSIS — M5136 Other intervertebral disc degeneration, lumbar region: Secondary | ICD-10-CM | POA: Diagnosis not present

## 2018-03-11 ENCOUNTER — Ambulatory Visit: Payer: Medicare Other | Admitting: Allergy & Immunology

## 2018-03-12 ENCOUNTER — Ambulatory Visit: Payer: Medicare Other | Admitting: Allergy & Immunology

## 2018-03-21 ENCOUNTER — Ambulatory Visit: Payer: Medicare Other | Admitting: Allergy & Immunology

## 2018-03-21 ENCOUNTER — Encounter: Payer: Self-pay | Admitting: Allergy & Immunology

## 2018-03-21 VITALS — BP 128/78 | HR 67 | Resp 18

## 2018-03-21 DIAGNOSIS — J683 Other acute and subacute respiratory conditions due to chemicals, gases, fumes and vapors: Secondary | ICD-10-CM

## 2018-03-21 DIAGNOSIS — J3089 Other allergic rhinitis: Secondary | ICD-10-CM | POA: Diagnosis not present

## 2018-03-21 DIAGNOSIS — J302 Other seasonal allergic rhinitis: Secondary | ICD-10-CM | POA: Diagnosis not present

## 2018-03-21 DIAGNOSIS — K219 Gastro-esophageal reflux disease without esophagitis: Secondary | ICD-10-CM

## 2018-03-21 MED ORDER — AZELASTINE HCL 0.1 % NA SOLN: 2.0000 | Freq: Every day | NASAL | 5 refills | Status: DC

## 2018-03-21 MED ORDER — LEVOCETIRIZINE DIHYDROCHLORIDE 5 MG PO TABS: 5.0000 mg | ORAL_TABLET | Freq: Every evening | ORAL | 2 refills | Status: DC

## 2018-03-21 MED ORDER — FLUTICASONE PROPIONATE 50 MCG/ACT NA SUSP: 2.0000 | Freq: Every day | NASAL | 5 refills | Status: DC

## 2018-03-21 MED ORDER — AUVI-Q 0.3 MG/0.3ML IJ SOAJ: 0.3000 mg | Freq: Once | INTRAMUSCULAR | 2 refills | Status: DC | PRN

## 2018-03-21 NOTE — Progress Notes (Signed)
FOLLOW UP  Date of Service/Encounter:  03/21/18   Assessment:   Reactive airways dysfunction syndrome  Seasonal and perennial allergic rhinitis(grasses, weeds, ragweed, trees, molds, cat, dog, cockroach, dust mite) - on allergen immunotherapy  Gastroesophageal reflux disease   Plan/Recommendations:   1. Chronic rhinitis (grasses, weeds, ragweed, trees, molds, cat, dog, cockroach, dust mite) - Continue with fluticasone 1-2 sprays per nostril daily + 1-2 sprays of azelastine nasal sprays per nostril daily - Continue with Xyzal 5mg  daily. - Continue with allergy shots at the same schedule. Audry Riles refilled today.  2. Cough - likely reactive airway dysfunction syndrome - We are going to start an inhaled medication to help with your coughing: Asmanex two puffs twice daily. - Spacer sample and demonstration provided.   3. Reflux - Stop ranitidine. - Start Pepcid (famotidine) 20mg  twice daily.  4. Return in about 3 months (around 06/20/2018).   Subjective:   [name] is a 67 y.o. female presenting today for follow up of  Chief Complaint  Patient presents with  . Allergic Rhinitis     [name] has a history of the following: Patient Active Problem List   Diagnosis Date Noted  . Seasonal and perennial allergic rhinitis 03/12/2017  . Reactive airways dysfunction syndrome 03/12/2017  . Gastroesophageal reflux disease 03/12/2017  . Closed fracture of right tibial plateau 08/20/2016    History obtained from: chart review and patient.  [facility] Care Provider is Assunta Found, MD.     [name] is a 67 y.o. female presenting for a follow up visit. She was last seen in June 2019. At that time, we changed her Dymista to two sprays of fluticasone and azelastine.   Since the last visit, she has mostly done well. However her father passed at age 36 with the service at the end of November.  She has broken up about this, but realizes  that he had a nice long life.  They are currently trying to make it through his will.  Apparently, she has 1 brother who is addicted this is made her mother is still living, although she has dementia and requires a lot of care.  Asthma/Respiratory Symptom History: She continues to have coughing episodes.  Overall, this has improved since starting her allergy shots, but even with aggressive control of her postnasal drip it continues to be a problem.  We have never started her on an inhaler since we always felt that controlling the postnasal drip would help, but she is open to trying anything else.  Allergic Rhinitis Symptom History: She had been doing fine with her allergy shots. She continues to have these set backs including back pain as well as the poison ivy. She remains on her nasal sprays.   Lexany is on allergen immunotherapy. She receives two injections. Immunotherapy script #1 contains trees, weeds, grasses, cat and dog. She currently receives 0.67mL of the RED vial (1/100). Immunotherapy script #2 contains molds, dust mites and cockroach. She currently receives 0.34mL of the RED vial (1/100). She started shots December of 2018 and reached maintenance in August 2019. She has had no reactions to her allergy shots.   She reports that she continues to have a cough that may occur any time of the day. She points to the top of her neck to show where it is. She has done an inhaler  Otherwise, there have been no changes to her past medical history, surgical history, family history, or social history.  Review of Systems: a 14-point review of systems is pertinent for what is mentioned in HPI.  Otherwise, all other systems were negative.  Constitutional: negative other than that listed in the HPI Eyes: negative other than that listed in the HPI Ears, nose, mouth, throat, and face: negative other than that listed in the HPI Respiratory: negative other than that listed in the HPI Cardiovascular:  negative other than that listed in the HPI Gastrointestinal: negative other than that listed in the HPI Genitourinary: negative other than that listed in the HPI Integument: negative other than that listed in the HPI Hematologic: negative other than that listed in the HPI Musculoskeletal: negative other than that listed in the HPI Neurological: negative other than that listed in the HPI Allergy/Immunologic: negative other than that listed in the HPI    Objective:   Blood pressure 128/78, pulse 67, resp. rate 18, SpO2 96 %. There is no height or weight on file to calculate BMI.   Physical Exam:  General: Alert, interactive, in no acute distress. Pleasant. Clearing through during the visit.  Eyes: No conjunctival injection bilaterally, no discharge on the right, no discharge on the left and no Horner-Trantas dots present. PERRL bilaterally. EOMI without pain. No photophobia.  Ears: Right TM pearly gray with normal light reflex, Left TM pearly gray with normal light reflex, Right TM intact without perforation and Left TM intact without perforation.  Nose/Throat: External nose within normal limits and septum midline. Turbinates edematous with clear discharge. Posterior oropharynx erythematous with cobblestoning in the posterior oropharynx. Tonsils 2+ without exudates.  Tongue without thrush. Lungs: Clear to auscultation without wheezing, rhonchi or rales. No increased work of breathing. CV: Normal S1/S2. No murmurs. Capillary refill <2 seconds.  Skin: Warm and dry, without lesions or rashes. Neuro:   Grossly intact. No focal deficits appreciated. Responsive to questions.  Diagnostic studies: none    Malachi BondsJoel , MD  [facility]

## 2018-03-21 NOTE — Patient Instructions (Addendum)
1. Chronic rhinitis (grasses, weeds, ragweed, trees, molds, cat, dog, cockroach, dust mite) - Continue with fluticasone 1-2 sprays per nostril daily + 1-2 sprays of azelastine nasal sprays per nostril daily - Continue with Xyzal 5mg  daily. - Continue with allergy shots at the same schedule. Audry Riles- AuviQ refilled today.  2. Cough - likely reactive airway dysfunction syndrome - We are going to start an inhaled medication to help with your coughing: Asmanex 100mcg two puffs twice daily. - Spacer sample and demonstration provided.   3. Reflux - Stop ranitidine. - Start Pepcid (famotidine) 20mg  twice daily.  4. Return in about 3 months (around 06/20/2018).   Please inform us of any Emergency Department visits, hospitalizations, or changes in symptoms. Call us before going to the ED for breathing or allergy symptoms since we might be able to fit you in for a sick visit. Feel free to contact us anytime with any questions, problems, or concerns.  It was a pleasure to see you again today!  Websites that have reliable patient information: 1. American Academy of Asthma, Allergy, and Immunology: www.aaaai.org 2. Food Allergy Research and Education (FARE): foodallergy.org 3. Mothers of Asthmatics: http://www.asthmacommunitynetwork.org 4. American College of Allergy, Asthma, and Immunology: MissingWeapons.cawww.acaai.org   Make sure you are registered to vote! If you have moved or changed any of your contact information, you will need to get this updated before voting!

## 2018-03-26 ENCOUNTER — Ambulatory Visit (INDEPENDENT_AMBULATORY_CARE_PROVIDER_SITE_OTHER): Payer: Medicare Other | Admitting: *Deleted

## 2018-03-26 DIAGNOSIS — J309 Allergic rhinitis, unspecified: Secondary | ICD-10-CM | POA: Diagnosis not present

## 2018-04-13 DIAGNOSIS — H66001 Acute suppurative otitis media without spontaneous rupture of ear drum, right ear: Secondary | ICD-10-CM | POA: Diagnosis not present

## 2018-04-13 DIAGNOSIS — R030 Elevated blood-pressure reading, without diagnosis of hypertension: Secondary | ICD-10-CM | POA: Diagnosis not present

## 2018-04-18 DIAGNOSIS — H6691 Otitis media, unspecified, right ear: Secondary | ICD-10-CM | POA: Diagnosis not present

## 2018-05-01 ENCOUNTER — Ambulatory Visit (INDEPENDENT_AMBULATORY_CARE_PROVIDER_SITE_OTHER): Payer: 59 | Admitting: Otolaryngology

## 2018-05-01 DIAGNOSIS — H6981 Other specified disorders of Eustachian tube, right ear: Secondary | ICD-10-CM | POA: Diagnosis not present

## 2018-05-01 DIAGNOSIS — H9011 Conductive hearing loss, unilateral, right ear, with unrestricted hearing on the contralateral side: Secondary | ICD-10-CM | POA: Diagnosis not present

## 2018-05-01 DIAGNOSIS — H903 Sensorineural hearing loss, bilateral: Secondary | ICD-10-CM | POA: Diagnosis not present

## 2018-05-14 ENCOUNTER — Ambulatory Visit (INDEPENDENT_AMBULATORY_CARE_PROVIDER_SITE_OTHER): Payer: Medicare Other

## 2018-05-14 DIAGNOSIS — J309 Allergic rhinitis, unspecified: Secondary | ICD-10-CM | POA: Diagnosis not present

## 2018-05-21 ENCOUNTER — Ambulatory Visit (INDEPENDENT_AMBULATORY_CARE_PROVIDER_SITE_OTHER): Payer: Medicare Other

## 2018-05-21 DIAGNOSIS — J309 Allergic rhinitis, unspecified: Secondary | ICD-10-CM | POA: Diagnosis not present

## 2018-05-28 ENCOUNTER — Ambulatory Visit (INDEPENDENT_AMBULATORY_CARE_PROVIDER_SITE_OTHER): Payer: Medicare Other

## 2018-05-28 DIAGNOSIS — J309 Allergic rhinitis, unspecified: Secondary | ICD-10-CM

## 2018-05-29 ENCOUNTER — Ambulatory Visit (INDEPENDENT_AMBULATORY_CARE_PROVIDER_SITE_OTHER): Payer: Medicare Other | Admitting: Otolaryngology

## 2018-05-29 DIAGNOSIS — H9311 Tinnitus, right ear: Secondary | ICD-10-CM | POA: Diagnosis not present

## 2018-05-29 DIAGNOSIS — H903 Sensorineural hearing loss, bilateral: Secondary | ICD-10-CM

## 2018-05-29 DIAGNOSIS — H9011 Conductive hearing loss, unilateral, right ear, with unrestricted hearing on the contralateral side: Secondary | ICD-10-CM | POA: Diagnosis not present

## 2018-06-04 ENCOUNTER — Ambulatory Visit (INDEPENDENT_AMBULATORY_CARE_PROVIDER_SITE_OTHER): Payer: Medicare Other | Admitting: *Deleted

## 2018-06-04 DIAGNOSIS — J309 Allergic rhinitis, unspecified: Secondary | ICD-10-CM | POA: Diagnosis not present

## 2018-06-13 ENCOUNTER — Ambulatory Visit (INDEPENDENT_AMBULATORY_CARE_PROVIDER_SITE_OTHER): Payer: Medicare Other

## 2018-06-13 DIAGNOSIS — J309 Allergic rhinitis, unspecified: Secondary | ICD-10-CM

## 2018-06-25 ENCOUNTER — Ambulatory Visit (INDEPENDENT_AMBULATORY_CARE_PROVIDER_SITE_OTHER): Payer: Medicare Other | Admitting: *Deleted

## 2018-06-25 DIAGNOSIS — J309 Allergic rhinitis, unspecified: Secondary | ICD-10-CM | POA: Diagnosis not present

## 2018-07-02 ENCOUNTER — Ambulatory Visit (INDEPENDENT_AMBULATORY_CARE_PROVIDER_SITE_OTHER): Payer: Medicare Other | Admitting: *Deleted

## 2018-07-02 DIAGNOSIS — J309 Allergic rhinitis, unspecified: Secondary | ICD-10-CM | POA: Diagnosis not present

## 2018-07-23 ENCOUNTER — Ambulatory Visit (INDEPENDENT_AMBULATORY_CARE_PROVIDER_SITE_OTHER): Payer: Medicare Other | Admitting: *Deleted

## 2018-07-23 DIAGNOSIS — J309 Allergic rhinitis, unspecified: Secondary | ICD-10-CM | POA: Diagnosis not present

## 2018-07-24 ENCOUNTER — Other Ambulatory Visit: Payer: Self-pay | Admitting: Allergy & Immunology

## 2018-07-30 ENCOUNTER — Ambulatory Visit (INDEPENDENT_AMBULATORY_CARE_PROVIDER_SITE_OTHER): Payer: Medicare Other

## 2018-07-30 DIAGNOSIS — J309 Allergic rhinitis, unspecified: Secondary | ICD-10-CM | POA: Diagnosis not present

## 2018-08-13 ENCOUNTER — Ambulatory Visit (INDEPENDENT_AMBULATORY_CARE_PROVIDER_SITE_OTHER): Payer: Medicare Other | Admitting: *Deleted

## 2018-08-13 DIAGNOSIS — J309 Allergic rhinitis, unspecified: Secondary | ICD-10-CM | POA: Diagnosis not present

## 2018-08-20 ENCOUNTER — Ambulatory Visit (INDEPENDENT_AMBULATORY_CARE_PROVIDER_SITE_OTHER): Payer: Medicare Other

## 2018-08-20 DIAGNOSIS — J309 Allergic rhinitis, unspecified: Secondary | ICD-10-CM | POA: Diagnosis not present

## 2018-08-27 ENCOUNTER — Ambulatory Visit (INDEPENDENT_AMBULATORY_CARE_PROVIDER_SITE_OTHER): Payer: Medicare Other

## 2018-08-27 DIAGNOSIS — J309 Allergic rhinitis, unspecified: Secondary | ICD-10-CM

## 2018-09-03 ENCOUNTER — Ambulatory Visit (INDEPENDENT_AMBULATORY_CARE_PROVIDER_SITE_OTHER): Payer: Medicare Other

## 2018-09-03 DIAGNOSIS — J309 Allergic rhinitis, unspecified: Secondary | ICD-10-CM | POA: Diagnosis not present

## 2018-09-10 ENCOUNTER — Ambulatory Visit (INDEPENDENT_AMBULATORY_CARE_PROVIDER_SITE_OTHER): Payer: Medicare Other

## 2018-09-10 DIAGNOSIS — J309 Allergic rhinitis, unspecified: Secondary | ICD-10-CM | POA: Diagnosis not present

## 2018-09-15 DIAGNOSIS — J3089 Other allergic rhinitis: Secondary | ICD-10-CM | POA: Diagnosis not present

## 2018-09-15 NOTE — Progress Notes (Signed)
VIALS EXP 09-15-2019 

## 2018-09-16 DIAGNOSIS — J301 Allergic rhinitis due to pollen: Secondary | ICD-10-CM | POA: Diagnosis not present

## 2018-09-17 ENCOUNTER — Ambulatory Visit (INDEPENDENT_AMBULATORY_CARE_PROVIDER_SITE_OTHER): Payer: Medicare Other | Admitting: *Deleted

## 2018-09-17 DIAGNOSIS — J309 Allergic rhinitis, unspecified: Secondary | ICD-10-CM | POA: Diagnosis not present

## 2018-09-17 DIAGNOSIS — Z0000 Encounter for general adult medical examination without abnormal findings: Secondary | ICD-10-CM | POA: Diagnosis not present

## 2018-09-17 IMAGING — DX DG KNEE COMPLETE 4+V*R*
4 series · 5 of 5 positions shown · non-contrast
Comparison: None.

CLINICAL DATA: Status post fall wall abutting couch, with diffuse
right knee pain. Initial encounter.

EXAM:
RIGHT KNEE - COMPLETE 4+ VIEW

[knee ap (1 of 3)]
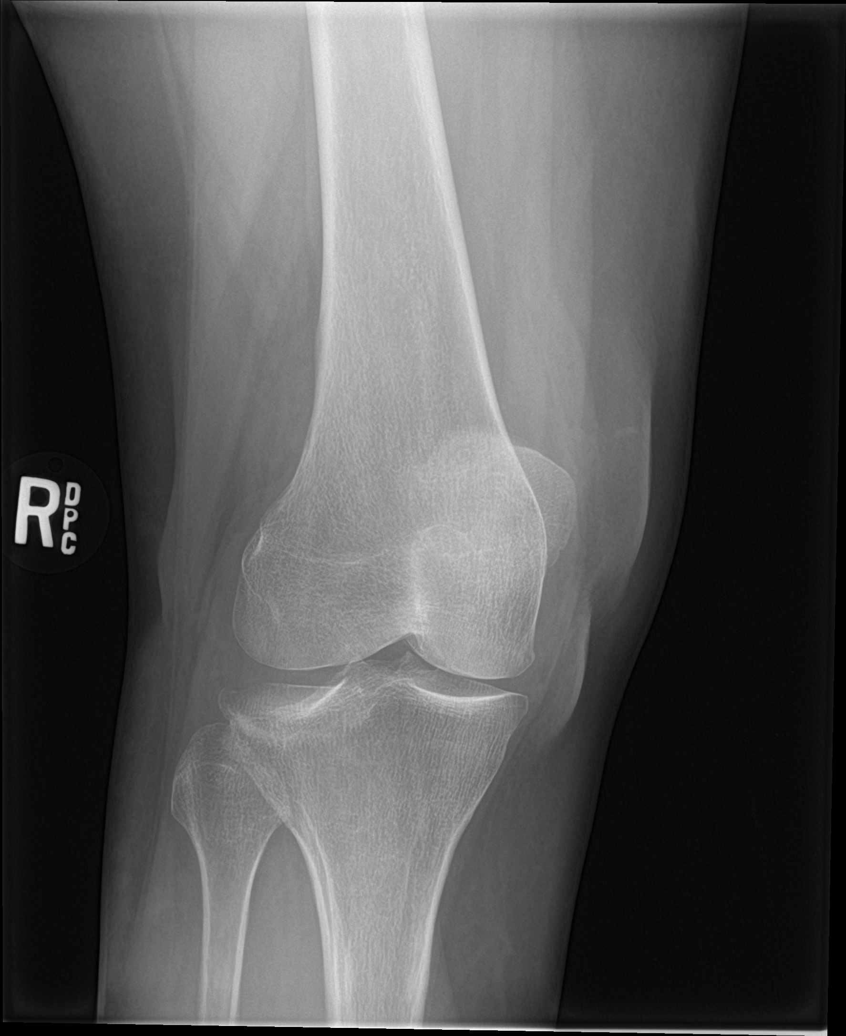

[Series 3: knee lat · 0.14mm/px · 2 of 2 slices shown]
[im 1/2]
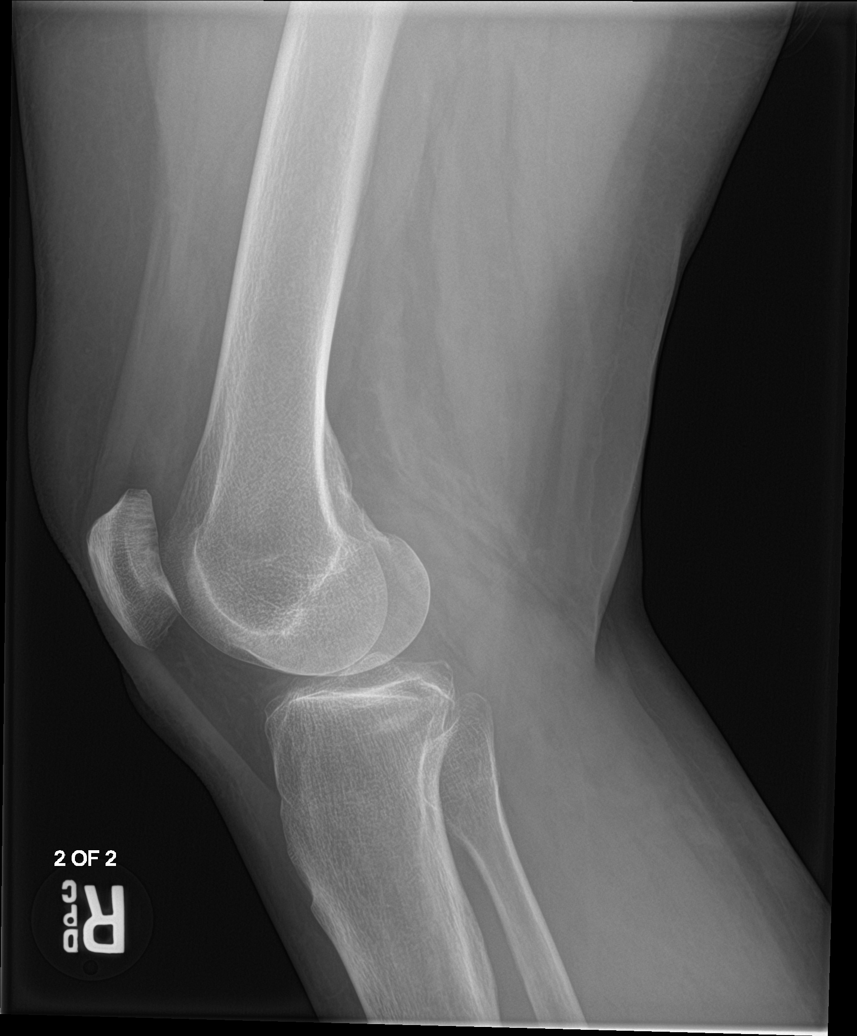
[im 2/2]
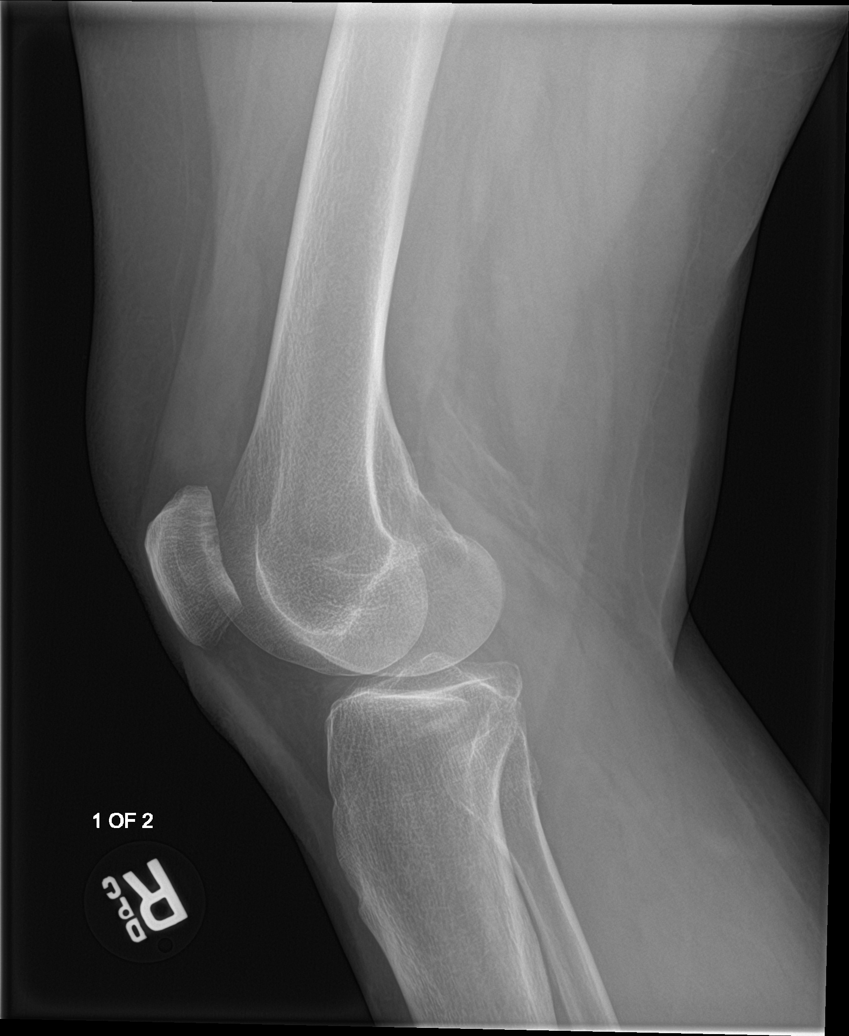

[knee ap (2 of 3)]
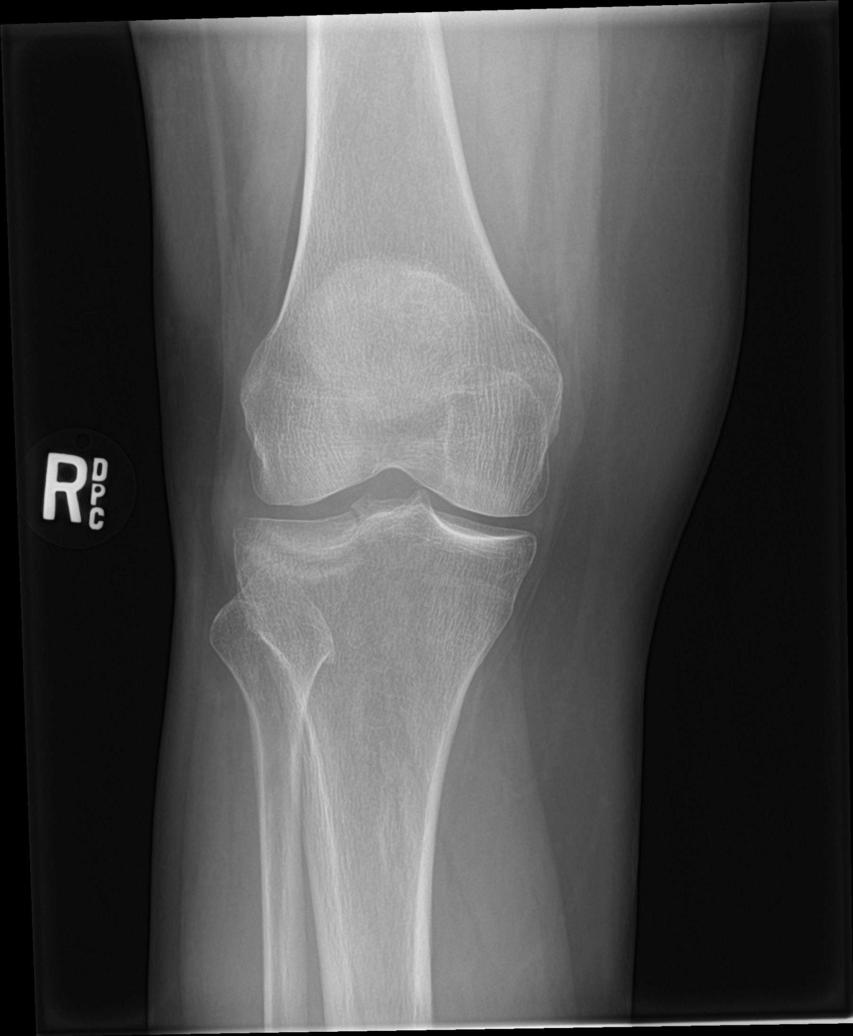

[knee ap (3 of 3)]
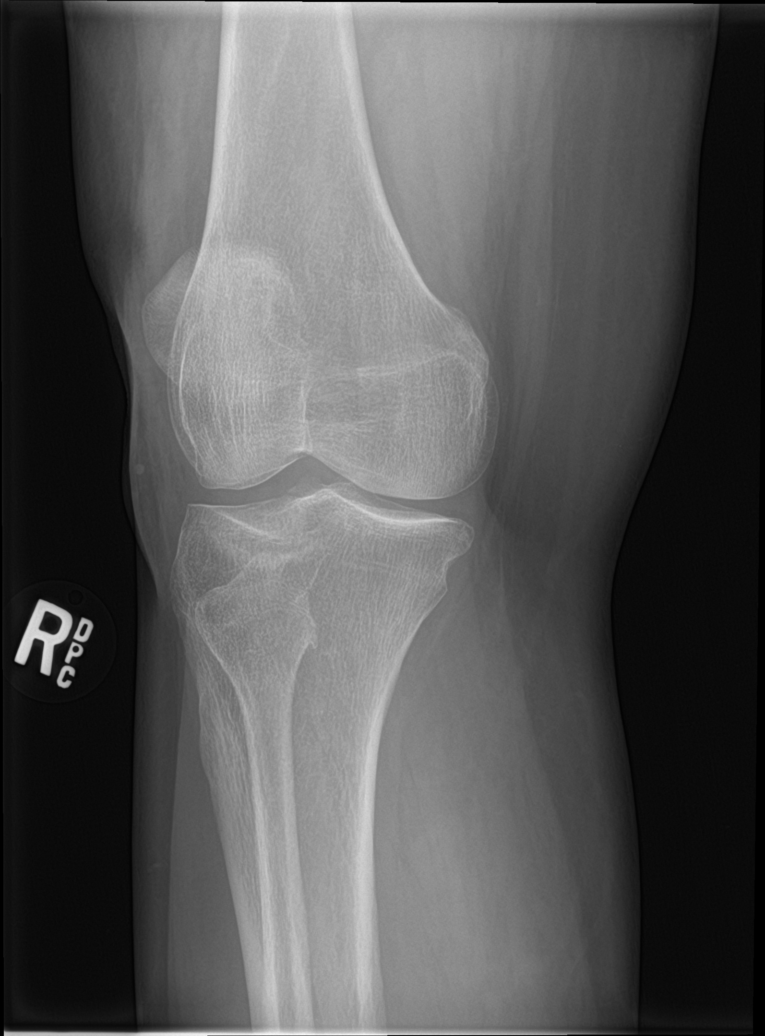

[5 of 5 positions shown; findings below may reference images not displayed]

FINDINGS: There is a mildly depressed mildly comminuted fracture involving the
lateral tibial plateau, extending along the tibial spine. An
associated moderate knee joint effusion is seen.

No additional fractures are identified. Mild cortical irregularity
noted along the articular surface of the patella.
IMPRESSION: 1. Mildly depressed mildly comminuted fracture involving the lateral
tibial plateau, extending along the tibial spine.
2. Moderate knee joint effusion noted.
3. Mild degenerative cortical irregularity along the articular
surface of the patella.

## 2018-09-18 DIAGNOSIS — E782 Mixed hyperlipidemia: Secondary | ICD-10-CM | POA: Diagnosis not present

## 2018-09-18 DIAGNOSIS — Z0000 Encounter for general adult medical examination without abnormal findings: Secondary | ICD-10-CM | POA: Diagnosis not present

## 2018-09-18 DIAGNOSIS — E7849 Other hyperlipidemia: Secondary | ICD-10-CM | POA: Diagnosis not present

## 2018-09-18 DIAGNOSIS — M810 Age-related osteoporosis without current pathological fracture: Secondary | ICD-10-CM | POA: Diagnosis not present

## 2018-09-18 DIAGNOSIS — R7309 Other abnormal glucose: Secondary | ICD-10-CM | POA: Diagnosis not present

## 2018-09-19 ENCOUNTER — Other Ambulatory Visit: Payer: Self-pay

## 2018-09-19 MED ORDER — EPINEPHRINE 0.3 MG/0.3ML IJ SOAJ: 0.3000 mg | Freq: Once | INTRAMUSCULAR | 1 refills | Status: DC | PRN

## 2018-09-19 NOTE — Telephone Encounter (Signed)
Refill has been requested for the epinephrine injector. I have sent this in as requested.

## 2018-09-24 ENCOUNTER — Ambulatory Visit (INDEPENDENT_AMBULATORY_CARE_PROVIDER_SITE_OTHER): Payer: Medicare Other | Admitting: *Deleted

## 2018-09-24 DIAGNOSIS — J309 Allergic rhinitis, unspecified: Secondary | ICD-10-CM

## 2018-10-03 ENCOUNTER — Ambulatory Visit (INDEPENDENT_AMBULATORY_CARE_PROVIDER_SITE_OTHER): Payer: Medicare Other

## 2018-10-03 DIAGNOSIS — J309 Allergic rhinitis, unspecified: Secondary | ICD-10-CM | POA: Diagnosis not present

## 2018-10-31 ENCOUNTER — Ambulatory Visit (INDEPENDENT_AMBULATORY_CARE_PROVIDER_SITE_OTHER): Payer: Medicare Other

## 2018-10-31 ENCOUNTER — Other Ambulatory Visit: Payer: Self-pay

## 2018-10-31 DIAGNOSIS — J309 Allergic rhinitis, unspecified: Secondary | ICD-10-CM

## 2018-11-03 DIAGNOSIS — N390 Urinary tract infection, site not specified: Secondary | ICD-10-CM | POA: Diagnosis not present

## 2018-11-03 DIAGNOSIS — R350 Frequency of micturition: Secondary | ICD-10-CM | POA: Diagnosis not present

## 2018-11-03 DIAGNOSIS — R319 Hematuria, unspecified: Secondary | ICD-10-CM | POA: Diagnosis not present

## 2018-11-14 ENCOUNTER — Ambulatory Visit (INDEPENDENT_AMBULATORY_CARE_PROVIDER_SITE_OTHER): Payer: Medicare Other

## 2018-11-14 ENCOUNTER — Other Ambulatory Visit: Payer: Self-pay

## 2018-11-14 DIAGNOSIS — J309 Allergic rhinitis, unspecified: Secondary | ICD-10-CM | POA: Diagnosis not present

## 2018-11-21 ENCOUNTER — Other Ambulatory Visit: Payer: Self-pay | Admitting: Allergy & Immunology

## 2018-11-28 ENCOUNTER — Ambulatory Visit (INDEPENDENT_AMBULATORY_CARE_PROVIDER_SITE_OTHER): Payer: Medicare Other | Admitting: *Deleted

## 2018-11-28 DIAGNOSIS — J309 Allergic rhinitis, unspecified: Secondary | ICD-10-CM

## 2018-12-17 ENCOUNTER — Ambulatory Visit (INDEPENDENT_AMBULATORY_CARE_PROVIDER_SITE_OTHER): Payer: Medicare Other

## 2018-12-17 DIAGNOSIS — J309 Allergic rhinitis, unspecified: Secondary | ICD-10-CM | POA: Diagnosis not present

## 2018-12-24 ENCOUNTER — Ambulatory Visit (INDEPENDENT_AMBULATORY_CARE_PROVIDER_SITE_OTHER): Payer: Medicare Other

## 2018-12-24 DIAGNOSIS — J309 Allergic rhinitis, unspecified: Secondary | ICD-10-CM | POA: Diagnosis not present

## 2018-12-31 ENCOUNTER — Ambulatory Visit (INDEPENDENT_AMBULATORY_CARE_PROVIDER_SITE_OTHER): Payer: Medicare Other

## 2018-12-31 DIAGNOSIS — J309 Allergic rhinitis, unspecified: Secondary | ICD-10-CM | POA: Diagnosis not present

## 2019-01-14 ENCOUNTER — Ambulatory Visit (INDEPENDENT_AMBULATORY_CARE_PROVIDER_SITE_OTHER): Payer: Medicare Other

## 2019-01-14 DIAGNOSIS — J309 Allergic rhinitis, unspecified: Secondary | ICD-10-CM | POA: Diagnosis not present

## 2019-01-28 ENCOUNTER — Ambulatory Visit (INDEPENDENT_AMBULATORY_CARE_PROVIDER_SITE_OTHER): Payer: Medicare Other

## 2019-01-28 DIAGNOSIS — J309 Allergic rhinitis, unspecified: Secondary | ICD-10-CM

## 2019-02-04 ENCOUNTER — Encounter: Payer: Self-pay | Admitting: *Deleted

## 2019-02-11 ENCOUNTER — Ambulatory Visit (INDEPENDENT_AMBULATORY_CARE_PROVIDER_SITE_OTHER): Payer: Medicare Other

## 2019-02-11 DIAGNOSIS — J309 Allergic rhinitis, unspecified: Secondary | ICD-10-CM

## 2019-02-23 DIAGNOSIS — J3081 Allergic rhinitis due to animal (cat) (dog) hair and dander: Secondary | ICD-10-CM | POA: Diagnosis not present

## 2019-02-23 DIAGNOSIS — I739 Peripheral vascular disease, unspecified: Secondary | ICD-10-CM | POA: Diagnosis not present

## 2019-02-23 DIAGNOSIS — M79672 Pain in left foot: Secondary | ICD-10-CM | POA: Diagnosis not present

## 2019-02-23 DIAGNOSIS — M79671 Pain in right foot: Secondary | ICD-10-CM | POA: Diagnosis not present

## 2019-02-23 NOTE — Progress Notes (Signed)
VIALS EXP 02-23-20 

## 2019-02-24 DIAGNOSIS — J3089 Other allergic rhinitis: Secondary | ICD-10-CM | POA: Diagnosis not present

## 2019-02-25 ENCOUNTER — Ambulatory Visit (INDEPENDENT_AMBULATORY_CARE_PROVIDER_SITE_OTHER): Payer: Medicare Other

## 2019-02-25 DIAGNOSIS — J309 Allergic rhinitis, unspecified: Secondary | ICD-10-CM

## 2019-03-09 DIAGNOSIS — M810 Age-related osteoporosis without current pathological fracture: Secondary | ICD-10-CM | POA: Diagnosis not present

## 2019-03-09 DIAGNOSIS — M19112 Post-traumatic osteoarthritis, left shoulder: Secondary | ICD-10-CM | POA: Diagnosis not present

## 2019-03-09 DIAGNOSIS — E7849 Other hyperlipidemia: Secondary | ICD-10-CM | POA: Diagnosis not present

## 2019-03-25 ENCOUNTER — Ambulatory Visit: Payer: Medicare Other | Admitting: Allergy & Immunology

## 2019-03-25 ENCOUNTER — Other Ambulatory Visit: Payer: Self-pay

## 2019-03-25 ENCOUNTER — Encounter: Payer: Self-pay | Admitting: Allergy & Immunology

## 2019-03-25 ENCOUNTER — Ambulatory Visit (INDEPENDENT_AMBULATORY_CARE_PROVIDER_SITE_OTHER): Payer: Medicare Other | Admitting: Allergy & Immunology

## 2019-03-25 VITALS — BP 128/72 | HR 85 | Temp 97.1°F

## 2019-03-25 DIAGNOSIS — J302 Other seasonal allergic rhinitis: Secondary | ICD-10-CM | POA: Diagnosis not present

## 2019-03-25 DIAGNOSIS — J683 Other acute and subacute respiratory conditions due to chemicals, gases, fumes and vapors: Secondary | ICD-10-CM | POA: Diagnosis not present

## 2019-03-25 DIAGNOSIS — J3089 Other allergic rhinitis: Secondary | ICD-10-CM | POA: Diagnosis not present

## 2019-03-25 DIAGNOSIS — J0100 Acute maxillary sinusitis, unspecified: Secondary | ICD-10-CM | POA: Diagnosis not present

## 2019-03-25 DIAGNOSIS — J309 Allergic rhinitis, unspecified: Secondary | ICD-10-CM | POA: Diagnosis not present

## 2019-03-25 DIAGNOSIS — K219 Gastro-esophageal reflux disease without esophagitis: Secondary | ICD-10-CM

## 2019-03-25 MED ORDER — AMOXICILLIN-POT CLAVULANATE 875-125 MG PO TABS: 1.0000 | ORAL_TABLET | Freq: Two times a day (BID) | ORAL | 0 refills | Status: AC

## 2019-03-25 NOTE — Progress Notes (Signed)
FOLLOW UP  Date of Service/Encounter:  03/25/19   Assessment:   Reactive airways dysfunction syndrome - trialing Spiriva today  Seasonal and perennial allergic rhinitis(grasses, weeds, ragweed, trees, molds, cat, dog, cockroach, dust mite)- on allergen immunotherapy  Gastroesophageal reflux disease  Acute sinusitis  Plan/Recommendations:   1. Chronic rhinitis (grasses, weeds, ragweed, trees, molds, cat, dog, cockroach, dust mite) - Continue with fluticasone 1-2 sprays per nostril daily + 1-2 sprays of azelastine nasal sprays per nostril daily - Continue with Xyzal 5mg , but increase to twice daily to see if this helps with the postnasal drip.  - Continue with allergy shots at the same schedule. refilled today.  - We will get an environmental panel to see if there is an allergen we are missing in your allergy shots.   2. Cough - likely reactive airway dysfunction syndrome - Lung function looks great today. - It does not seem that the albuterol helped you to feel any better. - I continue to think that this is not true asthma. - However, we are going to start Spiriva 2.5 mcg one puff once daily to see if this helps (sample provided). - Please call Audry Riles with an update in two weeks.   3. Reflux - Continue with Pepcid (famotidine) 20mg  twice daily.  4. Return in about 3 months (around 06/23/2019). This can be an in-person, a virtual Webex or a telephone follow up visit.  Subjective:   [name] is a 68 y.o. female presenting today for follow up of  Chief Complaint  Patient presents with  . Allergies    Having sinus drainage and ear pain previously. Better now.     [name] has a history of the following: Patient Active Problem List   Diagnosis Date Noted  . Seasonal and perennial allergic rhinitis 03/12/2017  . Reactive airways dysfunction syndrome 03/12/2017  . Gastroesophageal reflux disease 03/12/2017  . Closed fracture of right tibial  plateau 08/20/2016    History obtained from: chart review and patient.  [name] is a 68 y.o. female presenting for a follow up visit.  She was last seen in December 2019.  At that time, she was still having some breathing episodes.  I have always felt that this was reactive airways dysfunction syndrome.  We did try starting Asmanex at the last visit to see if this helped at all, but she did not feel that there was any different and in fact the second dose each day seems to have made her cough more.  For her allergic rhinitis, she was doing fine on her allergen immunotherapy.  She remained on her nasal sprays.  Since last visit, she has mostly done well.  She does continue to have a cough.  Asthma/Respiratory Symptom History: She remains on the Asmanex 2 puffs twice daily.  Unfortunately, after the second puff, she would have an uncontrollable coughing fit.  She stopped it after 2 months.  She did not feel that the addition of the Asmanex did much to help her cough at all in any way.  She has not required any prednisone.  Her cough is more upper airway, at least according to where she points.  She tells me that her "lungs are just fine".  She does not have a rescue inhaler at home and seems a little confused when I ask her about this.  She has had no fever or chest pain.  She does report some postnasal drip, which she feels is related to  her coughing.  She does have reflux and is on omeprazole.  This does control it really well.  Allergic Rhinitis Symptom History: She remains on her no sprays.  She takes Xyzal 5 mg in the morning.  She does not take anything at night.  She tells me that she spends the first part of her morning after waking up coughing and clearing her throat of phlegm.  She has not tried taking Xyzal at night.  She is currently endorsing some sinus pain and pressure for the past 4 weeks.  Overall, things seem to be getting somewhat better but they are very slow to improve.  She does not  have a sore throat any longer.  She continues to have some sinus pressure.  Her last antibiotic was whenever we gave her 1.  [name] on allergen immunotherapy. Shereceives two injections. Immunotherapy script #1contains trees, weeds, grasses, cat and dog.Shecurrently receives 0.6805mLof the RED vial (1/100). Immunotherapy script #2 containsmolds, dust mites and cockroach.Shecurrently receives 0.1505mLof the RED vial (1/100).Shestarted shots Decemberof 2018and reached maintenance in August 2019. She has had no reactions to her allergy shots.  Otherwise, there have been no changes to her past medical history, surgical history, family history, or social history.    Review of Systems  Constitutional: Negative.  Negative for chills, fever, malaise/fatigue and weight loss.  HENT: Negative.  Negative for congestion, ear discharge, ear pain, sinus pain and sore throat.   Eyes: Negative for pain, discharge and redness.  Respiratory: Negative for cough, sputum production, shortness of breath and wheezing.   Cardiovascular: Negative.  Negative for chest pain and palpitations.  Gastrointestinal: Negative for abdominal pain, constipation, diarrhea, heartburn, nausea and vomiting.  Skin: Negative.  Negative for itching and rash.  Neurological: Negative for dizziness and headaches.  Endo/Heme/Allergies: Negative for environmental allergies. Does not bruise/bleed easily.       Objective:   Blood pressure 128/72, pulse 85, temperature (!) 97.1 F (36.2 C), temperature source Temporal, SpO2 96 %. There is no height or weight on file to calculate BMI.   Physical Exam:  Physical Exam  Constitutional: She appears well-developed.  HENT:  Head: Normocephalic and atraumatic.  Right Ear: Tympanic membrane, external ear and ear canal normal.  Left Ear: Tympanic membrane, external ear and ear canal normal.  Nose: Mucosal edema present. No rhinorrhea, nasal deformity or septal deviation. No  epistaxis. Right sinus exhibits no maxillary sinus tenderness and no frontal sinus tenderness. Left sinus exhibits no maxillary sinus tenderness and no frontal sinus tenderness.  Mouth/Throat: Uvula is midline and oropharynx is clear and moist. Mucous membranes are not pale and not dry.  Turbinates are enlarged without purulent discharge.  There is some sinus pressure over the bilateral maxillary sinuses.  Eyes: Pupils are equal, round, and reactive to light. Conjunctivae and EOM are normal. Right eye exhibits no chemosis and no discharge. Left eye exhibits no chemosis and no discharge. Right conjunctiva is not injected. Left conjunctiva is not injected.  Cardiovascular: Normal rate, regular rhythm and normal heart sounds.  Respiratory: Effort normal and breath sounds normal. No accessory muscle usage. No tachypnea. No respiratory distress. She has no wheezes. She has no rhonchi. She has no rales. She exhibits no tenderness.  Coughing with deep inspiration.  No crackles or wheezes.  Lymphadenopathy:    She has no cervical adenopathy.  Neurological: She is alert.  Skin: No abrasion, no petechiae and no rash noted. Rash is not papular, not vesicular and not urticarial. No erythema. No  pallor.  Psychiatric: She has a normal mood and affect.     Diagnostic studies:    Spirometry: results normal (FEV1: 2.24/117%, FVC: 2.97/111%, FEV1/FVC: 75%).    Spirometry consistent with normal pattern. 4 puffs Xopenex given in clinic with significant improvement in FEV1 per ATS criteria.  Allergy Studies: none     Salvatore Marvel, MD  [facility]

## 2019-03-25 NOTE — Patient Instructions (Addendum)
1. Chronic rhinitis (grasses, weeds, ragweed, trees, molds, cat, dog, cockroach, dust mite) - Continue with fluticasone 1-2 sprays per nostril daily + 1-2 sprays of azelastine nasal sprays per nostril daily - Continue with Xyzal 5mg , but increase to twice daily to see if this helps with the postnasal drip.  - Continue with allergy shots at the same schedule. Wynona Luna refilled today.  - We will get an environmental panel to see if there is an allergen we are missing in your allergy shots.   2. Cough - likely reactive airway dysfunction syndrome - Lung function looks great today. - It does not seem that the albuterol helped you to feel any better. - I continue to think that this is not true asthma. - However, we are going to start Spiriva 2.5 mcg one puff once daily to see if this helps (sample provided). - Please call us with an update in two weeks.   3. Reflux - Continue with Pepcid (famotidine) 20mg  twice daily.  4. Return in about 3 months (around 06/23/2019). This can be an in-person, a virtual Webex or a telephone follow up visit.   Please inform us of any Emergency Department visits, hospitalizations, or changes in symptoms. Call us before going to the ED for breathing or allergy symptoms since we might be able to fit you in for a sick visit. Feel free to contact us anytime with any questions, problems, or concerns.  It was a pleasure to see you again today!  Websites that have reliable patient information: 1. American Academy of Asthma, Allergy, and Immunology: www.aaaai.org 2. Food Allergy Research and Education (FARE): foodallergy.org 3. Mothers of Asthmatics: http://www.asthmacommunitynetwork.org 4. American College of Allergy, Asthma, and Immunology: www.acaai.org  "Like" [place] on Facebook and Instagram for our latest updates!      Make sure you are registered to vote! If you have moved or changed any of your contact information, you will need to get this updated before  voting!  In some cases, you MAY be able to register to vote online: [email or web address]

## 2019-03-28 LAB — ALLERGEN PROFILE, MOLD
Aureobasidi Pullulans IgE: <0.1 kU/L
Candida Albicans IgE: <0.1 kU/L
M009-IgE Fusarium proliferatum: <0.1 kU/L
M014-IgE Epicoccum purpur: <0.1 kU/L
Phoma Betae IgE: <0.1 kU/L
Setomelanomma Rostrat: <0.1 kU/L

## 2019-03-28 LAB — IGE+ALLERGENS ZONE 2(30)

## 2019-04-01 ENCOUNTER — Other Ambulatory Visit: Payer: Self-pay | Admitting: Allergy & Immunology

## 2019-04-08 ENCOUNTER — Ambulatory Visit (INDEPENDENT_AMBULATORY_CARE_PROVIDER_SITE_OTHER): Payer: Medicare Other

## 2019-04-08 DIAGNOSIS — J309 Allergic rhinitis, unspecified: Secondary | ICD-10-CM | POA: Diagnosis not present

## 2019-04-16 DIAGNOSIS — H43813 Vitreous degeneration, bilateral: Secondary | ICD-10-CM | POA: Diagnosis not present

## 2019-04-16 DIAGNOSIS — H2513 Age-related nuclear cataract, bilateral: Secondary | ICD-10-CM | POA: Diagnosis not present

## 2019-04-16 DIAGNOSIS — H524 Presbyopia: Secondary | ICD-10-CM | POA: Diagnosis not present

## 2019-04-24 ENCOUNTER — Ambulatory Visit (INDEPENDENT_AMBULATORY_CARE_PROVIDER_SITE_OTHER): Payer: Medicare Other

## 2019-04-24 DIAGNOSIS — J309 Allergic rhinitis, unspecified: Secondary | ICD-10-CM | POA: Diagnosis not present

## 2019-04-29 ENCOUNTER — Ambulatory Visit (INDEPENDENT_AMBULATORY_CARE_PROVIDER_SITE_OTHER): Payer: Medicare Other

## 2019-04-29 DIAGNOSIS — J309 Allergic rhinitis, unspecified: Secondary | ICD-10-CM

## 2019-04-30 DIAGNOSIS — R252 Cramp and spasm: Secondary | ICD-10-CM | POA: Diagnosis not present

## 2019-05-10 DIAGNOSIS — M810 Age-related osteoporosis without current pathological fracture: Secondary | ICD-10-CM | POA: Diagnosis not present

## 2019-05-10 DIAGNOSIS — E7849 Other hyperlipidemia: Secondary | ICD-10-CM | POA: Diagnosis not present

## 2019-05-10 DIAGNOSIS — M19012 Primary osteoarthritis, left shoulder: Secondary | ICD-10-CM | POA: Diagnosis not present

## 2019-05-13 ENCOUNTER — Ambulatory Visit (INDEPENDENT_AMBULATORY_CARE_PROVIDER_SITE_OTHER): Payer: Medicare Other

## 2019-05-13 DIAGNOSIS — J309 Allergic rhinitis, unspecified: Secondary | ICD-10-CM

## 2019-05-29 ENCOUNTER — Ambulatory Visit (INDEPENDENT_AMBULATORY_CARE_PROVIDER_SITE_OTHER): Payer: Medicare Other

## 2019-05-29 DIAGNOSIS — J309 Allergic rhinitis, unspecified: Secondary | ICD-10-CM

## 2019-06-04 DIAGNOSIS — H906 Mixed conductive and sensorineural hearing loss, bilateral: Secondary | ICD-10-CM | POA: Diagnosis not present

## 2019-06-17 ENCOUNTER — Ambulatory Visit (INDEPENDENT_AMBULATORY_CARE_PROVIDER_SITE_OTHER): Payer: Medicare Other

## 2019-06-17 DIAGNOSIS — J309 Allergic rhinitis, unspecified: Secondary | ICD-10-CM | POA: Diagnosis not present

## 2019-06-24 ENCOUNTER — Other Ambulatory Visit: Payer: Self-pay | Admitting: Allergy & Immunology

## 2019-06-24 ENCOUNTER — Ambulatory Visit (INDEPENDENT_AMBULATORY_CARE_PROVIDER_SITE_OTHER): Payer: Medicare Other | Admitting: Allergy & Immunology

## 2019-06-24 ENCOUNTER — Encounter: Payer: Self-pay | Admitting: Allergy & Immunology

## 2019-06-24 ENCOUNTER — Other Ambulatory Visit: Payer: Self-pay

## 2019-06-24 VITALS — BP 110/78 | HR 74 | Temp 97.9°F | Resp 16 | Ht 63.0 in | Wt 163.0 lb

## 2019-06-24 DIAGNOSIS — J3089 Other allergic rhinitis: Secondary | ICD-10-CM

## 2019-06-24 DIAGNOSIS — J683 Other acute and subacute respiratory conditions due to chemicals, gases, fumes and vapors: Secondary | ICD-10-CM | POA: Diagnosis not present

## 2019-06-24 DIAGNOSIS — J302 Other seasonal allergic rhinitis: Secondary | ICD-10-CM

## 2019-06-24 DIAGNOSIS — K219 Gastro-esophageal reflux disease without esophagitis: Secondary | ICD-10-CM

## 2019-06-24 MED ORDER — SPIRIVA RESPIMAT 2.5 MCG/ACT IN AERS: 1.0000 | INHALATION_SPRAY | Freq: Every day | RESPIRATORY_TRACT | 5 refills | Status: DC

## 2019-06-24 NOTE — Patient Instructions (Addendum)
1. Chronic rhinitis (grasses, weeds, ragweed, trees, molds, cat, dog, cockroach, dust mite) - Continue with fluticasone 1-2 sprays per nostril daily + 1-2 sprays of azelastine nasal sprays per nostril daily - Continue with Xyzal 5mg  1-2 times daily. - Agree with the addition of the Mucinex to with drying it up.  - Continue with allergy shots at the same schedule. refilled today.   2. Cough - likely reactive airway dysfunction syndrome - Lung function looks great today. - We will send in a prescription for Spiriva to see if this is covered.   3. Reflux  - Continue with Pepcid (famotidine) 20mg  twice daily as needed.   4. Return in about 6 months (around 12/25/2019). This can be an in-person, a virtual Webex or a telephone follow up visit.   Please inform of any Emergency Department visits, hospitalizations, or changes in symptoms. Call 12/27/2019 before going to the ED for breathing or allergy symptoms since we might be able to fit you in for a sick visit. Feel free to contact us anytime with any questions, problems, or concerns.  It was a pleasure to see you again today!  Websites that have reliable patient information: 1. American Academy of Asthma, Allergy, and Immunology: www.aaaai.org 2. Food Allergy Research and Education (FARE): foodallergy.org 3. Mothers of Asthmatics: http://www.asthmacommunitynetwork.org 4. American College of Allergy, Asthma, and Immunology: www.acaai.org   COVID-19 Vaccine Information can be found at: [place] For questions related to vaccine distribution or appointments, please email [email or web address] or call [phone].     "Like" [email or web address] on Facebook and Instagram for our latest updates!        Make sure you are registered to vote! If you have moved or changed any of your contact information, you will need to get this updated before voting!  In some cases, you MAY be able to  register to vote online: [phone]

## 2019-06-24 NOTE — Progress Notes (Signed)
FOLLOW UP  Date of Service/Encounter:  06/24/19   Assessment:   Reactive airways dysfunction syndrome - improved with Spiriva  Seasonal and perennial allergic rhinitis(grasses, weeds, ragweed, trees, molds, cat, dog, cockroach, dust mite)- on allergen immunotherapy  Gastroesophageal reflux disease   [name] seems to be doing better on the current regimen. She is not having daily symptoms of coughing, instead having outbursts about once per week. As with previous visits, they seem to be centered around exposures. In particular, whenever she is at her mother's house where her brother lives, apparently there is a lot of incense and other aromatic triggers that make her cough a lot worse. She did feel better when she was on the Spiriva, so we are going to go ahead and send that in. Regarding her allergen immunotherapy, she is tolerating the up dosing without any issues. She does feel that the addition of the Mucinex in combination with the Xyzal is helping clear up a lot of her mucus. She remains on the nasal sprays as well. Ideally, I would like her to get off of some of the medications, as this is one of the goals of allergen immunotherapy. However, I want to make sure that she is stable before we make any big changes. She is in agreement with this plan.  Plan/Recommendations:   1. Chronic rhinitis (grasses, weeds, ragweed, trees, molds, cat, dog, cockroach, dust mite) - Continue with fluticasone 1-2 sprays per nostril daily + 1-2 sprays of azelastine nasal sprays per nostril daily - Continue with Xyzal 5mg  1-2 times daily. - Agree with the addition of the Mucinex to with drying it up.  - Continue with allergy shots at the same schedule. refilled today.   2. Cough - likely reactive airway dysfunction syndrome - Lung function looks great today. - We will send in a prescription for Spiriva to see if this is covered.   3. Reflux  - Continue with Pepcid (famotidine) 20mg   twice daily as needed.   4. Return in about 6 months (around 12/25/2019). This can be an in-person, a virtual Webex or a telephone follow up visit.   Subjective:   [name] is a 69 y.o. female presenting today for follow up of  Chief Complaint  Patient presents with  . Allergic Rhinitis     Medication maintance    [name] has a history of the following: Patient Active Problem List   Diagnosis Date Noted  . Seasonal and perennial allergic rhinitis 03/12/2017  . Reactive airways dysfunction syndrome 03/12/2017  . Gastroesophageal reflux disease 03/12/2017  . Closed fracture of right tibial plateau 08/20/2016    History obtained from: chart review and patient.  [name] is a 69 y.o. female presenting for a follow up visit. She was last seen in December 2020. At that time, she was endorsing a lot of postnasal drip. We recommended doing Xyzal twice a day. We also continued with fluticasone and azelastine up to twice daily. Her shots were going well. We refilled her Auvi-Q. We did get an environmental allergy panel to see if we are missing anything in her allergen immunotherapy vials. Unfortunately, she was negative to the entire panel, including an extended mold panel.  Since last visit, she reports doing fairly well. She did develop a coughing spell last night. She is feeling slightly better today. These spells are not lasting very long, thankfully, and only occurring about once per week at the most. This is much better than  when she first saw me and they were occurring multiple times per week. She has not needed any prednisone for these. She does feel that the addition of the Spiriva from the last visit did help with her symptoms and is interested in starting this. We never did send a prescription at the last visit.  She was noted to have retracted TMs a couple of weeks ago. She was told that she was getting over something or getting ready to have something. She has been  taking Mucinex in the afternoons to help clear things up. She does remain on her nasal sprays. She has not had a fever and has not been around anyone else who has been ill.  [name] on allergen immunotherapy. [name] two injections. Immunotherapy script #1contains trees, weeds, grasses, cat and dog.Shecurrently receives 0.13mLof theREDvial (1/100). Immunotherapy script #2 containsmolds, dust mites and cockroach.Shecurrently receives 0.63mLof theREDvial (1/100).Shestarted shots Decemberof 2018andreached maintenance in August 2019. She has had no reactions to her allergy shots.  She did receive her Moderna vaccines. She did have a sore arm, but otherwise did fine.  Otherwise, there have been no changes to her past medical history, surgical history, family history, or social history.    Review of Systems  Constitutional: Negative.  Negative for fever, malaise/fatigue and weight loss.  HENT: Negative.  Negative for congestion, ear discharge, ear pain, nosebleeds and sinus pain.        Positive for postnasal drip.  Eyes: Negative for pain, discharge and redness.  Respiratory: Positive for cough. Negative for sputum production, shortness of breath and wheezing.   Cardiovascular: Negative.  Negative for chest pain and palpitations.  Gastrointestinal: Negative for abdominal pain, heartburn, nausea and vomiting.  Skin: Negative.  Negative for itching and rash.  Neurological: Negative for dizziness and headaches.  Endo/Heme/Allergies: Negative for environmental allergies. Does not bruise/bleed easily.       Objective:   Blood pressure 110/78, pulse 74, temperature 97.9 F (36.6 C), temperature source Temporal, resp. rate 16, height 5\' 3"  (1.6 m), weight 163 lb (73.9 kg), SpO2 98 %. Body mass index is 28.87 kg/m.   Physical Exam:  Physical Exam  Constitutional: She appears well-developed.  Pleasant female. Very talkative and smiling. Wearing a lovely [place] T-shirt.   HENT:  Head: Normocephalic and atraumatic.  Right Ear: Tympanic membrane, external ear and ear canal normal.  Left Ear: Tympanic membrane, external ear and ear canal normal.  Nose: Mucosal edema and rhinorrhea present. No nasal deformity or septal deviation. No epistaxis. Right sinus exhibits no maxillary sinus tenderness and no frontal sinus tenderness. Left sinus exhibits no maxillary sinus tenderness and no frontal sinus tenderness.  Mouth/Throat: Uvula is midline and oropharynx is clear and moist. Mucous membranes are not pale and not dry.  Turbinates enlarged bilaterally. There is clear discharge. She does have cobblestoning in the posterior oropharynx. Tonsils are normal bilaterally without exudates.  Eyes: Pupils are equal, round, and reactive to light. Conjunctivae and EOM are normal. Right eye exhibits no chemosis and no discharge. Left eye exhibits no chemosis and no discharge. Right conjunctiva is not injected. Left conjunctiva is not injected.  Cardiovascular: Normal rate, regular rhythm and normal heart sounds.  Respiratory: Effort normal and breath sounds normal. No accessory muscle usage. No tachypnea. No respiratory distress. She has no wheezes. She has no rhonchi. She has no rales. She exhibits no tenderness.  Moving air well in all lung fields. No crackles or wheezes. There is occasional coarse upper airway noise.  Lymphadenopathy:    She has no cervical adenopathy.  Neurological: She is alert.  Skin: No abrasion, no petechiae and no rash noted. Rash is not papular, not vesicular and not urticarial. No erythema. No pallor.  No eczematous or urticarial lesions noted.  Psychiatric: She has a normal mood and affect.     Diagnostic studies: none     Malachi Bonds, MD  [facility]

## 2019-07-08 DIAGNOSIS — M810 Age-related osteoporosis without current pathological fracture: Secondary | ICD-10-CM | POA: Diagnosis not present

## 2019-07-08 DIAGNOSIS — E7849 Other hyperlipidemia: Secondary | ICD-10-CM | POA: Diagnosis not present

## 2019-07-08 DIAGNOSIS — M19012 Primary osteoarthritis, left shoulder: Secondary | ICD-10-CM | POA: Diagnosis not present

## 2019-07-17 ENCOUNTER — Ambulatory Visit (INDEPENDENT_AMBULATORY_CARE_PROVIDER_SITE_OTHER): Payer: Medicare Other

## 2019-07-17 DIAGNOSIS — J309 Allergic rhinitis, unspecified: Secondary | ICD-10-CM | POA: Diagnosis not present

## 2019-07-29 ENCOUNTER — Ambulatory Visit (INDEPENDENT_AMBULATORY_CARE_PROVIDER_SITE_OTHER): Payer: Medicare Other

## 2019-07-29 DIAGNOSIS — J309 Allergic rhinitis, unspecified: Secondary | ICD-10-CM | POA: Diagnosis not present

## 2019-07-30 DIAGNOSIS — B351 Tinea unguium: Secondary | ICD-10-CM | POA: Diagnosis not present

## 2019-07-30 DIAGNOSIS — M79671 Pain in right foot: Secondary | ICD-10-CM | POA: Diagnosis not present

## 2019-07-30 DIAGNOSIS — M79674 Pain in right toe(s): Secondary | ICD-10-CM | POA: Diagnosis not present

## 2019-08-04 DIAGNOSIS — Z1231 Encounter for screening mammogram for malignant neoplasm of breast: Secondary | ICD-10-CM | POA: Diagnosis not present

## 2019-08-04 DIAGNOSIS — M8589 Other specified disorders of bone density and structure, multiple sites: Secondary | ICD-10-CM | POA: Diagnosis not present

## 2019-08-26 ENCOUNTER — Ambulatory Visit (INDEPENDENT_AMBULATORY_CARE_PROVIDER_SITE_OTHER): Payer: Medicare Other

## 2019-08-26 DIAGNOSIS — J309 Allergic rhinitis, unspecified: Secondary | ICD-10-CM | POA: Diagnosis not present

## 2019-09-07 DIAGNOSIS — M810 Age-related osteoporosis without current pathological fracture: Secondary | ICD-10-CM | POA: Diagnosis not present

## 2019-09-07 DIAGNOSIS — E7849 Other hyperlipidemia: Secondary | ICD-10-CM | POA: Diagnosis not present

## 2019-09-07 DIAGNOSIS — M19012 Primary osteoarthritis, left shoulder: Secondary | ICD-10-CM | POA: Diagnosis not present

## 2019-09-09 ENCOUNTER — Ambulatory Visit (INDEPENDENT_AMBULATORY_CARE_PROVIDER_SITE_OTHER): Payer: Medicare Other

## 2019-09-09 DIAGNOSIS — J309 Allergic rhinitis, unspecified: Secondary | ICD-10-CM

## 2019-09-16 ENCOUNTER — Ambulatory Visit (INDEPENDENT_AMBULATORY_CARE_PROVIDER_SITE_OTHER): Payer: Medicare Other

## 2019-09-16 DIAGNOSIS — J309 Allergic rhinitis, unspecified: Secondary | ICD-10-CM

## 2019-09-23 DIAGNOSIS — J45909 Unspecified asthma, uncomplicated: Secondary | ICD-10-CM | POA: Diagnosis not present

## 2019-09-23 DIAGNOSIS — G43909 Migraine, unspecified, not intractable, without status migrainosus: Secondary | ICD-10-CM | POA: Diagnosis not present

## 2019-09-23 DIAGNOSIS — Z0000 Encounter for general adult medical examination without abnormal findings: Secondary | ICD-10-CM | POA: Diagnosis not present

## 2019-09-23 DIAGNOSIS — Z1389 Encounter for screening for other disorder: Secondary | ICD-10-CM | POA: Diagnosis not present

## 2019-09-23 DIAGNOSIS — M1991 Primary osteoarthritis, unspecified site: Secondary | ICD-10-CM | POA: Diagnosis not present

## 2019-10-07 ENCOUNTER — Ambulatory Visit (INDEPENDENT_AMBULATORY_CARE_PROVIDER_SITE_OTHER): Payer: Medicare Other

## 2019-10-07 DIAGNOSIS — J309 Allergic rhinitis, unspecified: Secondary | ICD-10-CM | POA: Diagnosis not present

## 2019-10-09 NOTE — Progress Notes (Signed)
Vials EXP 10-08-2020 

## 2019-10-13 DIAGNOSIS — J3081 Allergic rhinitis due to animal (cat) (dog) hair and dander: Secondary | ICD-10-CM | POA: Diagnosis not present

## 2019-10-14 DIAGNOSIS — J3089 Other allergic rhinitis: Secondary | ICD-10-CM | POA: Diagnosis not present

## 2019-10-21 ENCOUNTER — Ambulatory Visit (INDEPENDENT_AMBULATORY_CARE_PROVIDER_SITE_OTHER): Payer: Medicare Other

## 2019-10-21 DIAGNOSIS — J309 Allergic rhinitis, unspecified: Secondary | ICD-10-CM | POA: Diagnosis not present

## 2019-11-04 ENCOUNTER — Ambulatory Visit (INDEPENDENT_AMBULATORY_CARE_PROVIDER_SITE_OTHER): Payer: Medicare Other

## 2019-11-04 DIAGNOSIS — J309 Allergic rhinitis, unspecified: Secondary | ICD-10-CM

## 2019-11-06 DIAGNOSIS — E7849 Other hyperlipidemia: Secondary | ICD-10-CM | POA: Diagnosis not present

## 2019-11-06 DIAGNOSIS — J45909 Unspecified asthma, uncomplicated: Secondary | ICD-10-CM | POA: Diagnosis not present

## 2019-11-06 DIAGNOSIS — M810 Age-related osteoporosis without current pathological fracture: Secondary | ICD-10-CM | POA: Diagnosis not present

## 2019-11-06 DIAGNOSIS — M19012 Primary osteoarthritis, left shoulder: Secondary | ICD-10-CM | POA: Diagnosis not present

## 2019-11-12 DIAGNOSIS — M79671 Pain in right foot: Secondary | ICD-10-CM | POA: Diagnosis not present

## 2019-11-12 DIAGNOSIS — M79674 Pain in right toe(s): Secondary | ICD-10-CM | POA: Diagnosis not present

## 2019-11-12 DIAGNOSIS — B351 Tinea unguium: Secondary | ICD-10-CM | POA: Diagnosis not present

## 2019-11-18 ENCOUNTER — Ambulatory Visit (INDEPENDENT_AMBULATORY_CARE_PROVIDER_SITE_OTHER): Payer: Medicare Other

## 2019-11-18 DIAGNOSIS — J309 Allergic rhinitis, unspecified: Secondary | ICD-10-CM

## 2019-11-27 ENCOUNTER — Ambulatory Visit (INDEPENDENT_AMBULATORY_CARE_PROVIDER_SITE_OTHER): Payer: Medicare Other

## 2019-11-27 DIAGNOSIS — J309 Allergic rhinitis, unspecified: Secondary | ICD-10-CM | POA: Diagnosis not present

## 2019-11-30 DIAGNOSIS — E782 Mixed hyperlipidemia: Secondary | ICD-10-CM | POA: Diagnosis not present

## 2019-11-30 DIAGNOSIS — Z1389 Encounter for screening for other disorder: Secondary | ICD-10-CM | POA: Diagnosis not present

## 2019-11-30 DIAGNOSIS — Z0000 Encounter for general adult medical examination without abnormal findings: Secondary | ICD-10-CM | POA: Diagnosis not present

## 2019-11-30 DIAGNOSIS — E039 Hypothyroidism, unspecified: Secondary | ICD-10-CM | POA: Diagnosis not present

## 2019-12-02 ENCOUNTER — Ambulatory Visit (INDEPENDENT_AMBULATORY_CARE_PROVIDER_SITE_OTHER): Payer: Medicare Other

## 2019-12-02 DIAGNOSIS — J309 Allergic rhinitis, unspecified: Secondary | ICD-10-CM | POA: Diagnosis not present

## 2019-12-11 ENCOUNTER — Ambulatory Visit (INDEPENDENT_AMBULATORY_CARE_PROVIDER_SITE_OTHER): Payer: Medicare Other

## 2019-12-11 DIAGNOSIS — J309 Allergic rhinitis, unspecified: Secondary | ICD-10-CM

## 2019-12-18 ENCOUNTER — Ambulatory Visit (INDEPENDENT_AMBULATORY_CARE_PROVIDER_SITE_OTHER): Payer: Medicare Other

## 2019-12-18 DIAGNOSIS — J309 Allergic rhinitis, unspecified: Secondary | ICD-10-CM

## 2019-12-25 ENCOUNTER — Ambulatory Visit: Payer: Medicare Other | Admitting: Allergy & Immunology

## 2020-01-06 ENCOUNTER — Other Ambulatory Visit: Payer: Self-pay | Admitting: Allergy & Immunology

## 2020-01-06 NOTE — Telephone Encounter (Signed)
Sent in curtsey refill, called patient so she can schedule an in office visit for future refills.

## 2020-01-08 ENCOUNTER — Ambulatory Visit (INDEPENDENT_AMBULATORY_CARE_PROVIDER_SITE_OTHER): Payer: Medicare Other

## 2020-01-08 DIAGNOSIS — J309 Allergic rhinitis, unspecified: Secondary | ICD-10-CM | POA: Diagnosis not present

## 2020-01-20 ENCOUNTER — Ambulatory Visit: Payer: Medicare Other | Admitting: Allergy & Immunology

## 2020-01-29 ENCOUNTER — Ambulatory Visit (INDEPENDENT_AMBULATORY_CARE_PROVIDER_SITE_OTHER): Payer: Medicare Other

## 2020-01-29 DIAGNOSIS — J309 Allergic rhinitis, unspecified: Secondary | ICD-10-CM

## 2020-02-17 ENCOUNTER — Encounter: Payer: Self-pay | Admitting: Allergy & Immunology

## 2020-02-17 ENCOUNTER — Ambulatory Visit: Payer: Self-pay

## 2020-02-17 ENCOUNTER — Other Ambulatory Visit: Payer: Self-pay

## 2020-02-17 ENCOUNTER — Ambulatory Visit: Payer: Medicare Other | Admitting: Allergy & Immunology

## 2020-02-17 VITALS — BP 112/80 | HR 64 | Temp 97.4°F | Resp 16 | Ht 63.0 in | Wt 160.4 lb

## 2020-02-17 DIAGNOSIS — J302 Other seasonal allergic rhinitis: Secondary | ICD-10-CM

## 2020-02-17 DIAGNOSIS — J309 Allergic rhinitis, unspecified: Secondary | ICD-10-CM

## 2020-02-17 DIAGNOSIS — J683 Other acute and subacute respiratory conditions due to chemicals, gases, fumes and vapors: Secondary | ICD-10-CM | POA: Diagnosis not present

## 2020-02-17 DIAGNOSIS — J3089 Other allergic rhinitis: Secondary | ICD-10-CM

## 2020-02-17 DIAGNOSIS — K219 Gastro-esophageal reflux disease without esophagitis: Secondary | ICD-10-CM | POA: Diagnosis not present

## 2020-02-17 MED ORDER — ALBUTEROL SULFATE HFA 108 (90 BASE) MCG/ACT IN AERS: INHALATION_SPRAY | RESPIRATORY_TRACT | 2 refills | Status: DC

## 2020-02-17 MED ORDER — FLUTICASONE PROPIONATE 50 MCG/ACT NA SUSP
NASAL | 5 refills | Status: DC
Start: 2020-02-17 — End: 2021-07-12

## 2020-02-17 MED ORDER — LEVOCETIRIZINE DIHYDROCHLORIDE 5 MG PO TABS
ORAL_TABLET | ORAL | 5 refills | Status: DC
Start: 2020-02-17 — End: 2020-11-21

## 2020-02-17 MED ORDER — SPIRIVA RESPIMAT 2.5 MCG/ACT IN AERS: 1.0000 | INHALATION_SPRAY | Freq: Every day | RESPIRATORY_TRACT | 2 refills | Status: DC

## 2020-02-17 MED ORDER — AZELASTINE HCL 0.1 % NA SOLN: 2.0000 | Freq: Every day | NASAL | 5 refills | Status: DC

## 2020-02-17 MED ORDER — FLOVENT HFA 110 MCG/ACT IN AERO: INHALATION_SPRAY | RESPIRATORY_TRACT | 5 refills | Status: DC

## 2020-02-17 MED ORDER — EPINEPHRINE 0.3 MG/0.3ML IJ SOAJ: 0.3000 mg | Freq: Once | INTRAMUSCULAR | 1 refills | Status: DC | PRN

## 2020-02-17 NOTE — Patient Instructions (Addendum)
1. Chronic rhinitis (grasses, weeds, ragweed, trees, molds, cat, dog, cockroach, dust mite) - Continue with fluticasone 1-2 sprays per nostril daily + 1-2 sprays of azelastine nasal sprays per nostril daily - Continue with Xyzal 5mg  1-2 times daily (STOP FRIDGE NOVEMBER 12TH FOR TESTING).  - Agree with the addition of the Mucinex to with drying it up.  - Continue with allergy shots at the same schedule. - We are going to schedule you for repeat testing to see if we are missing something in your allergy shots.    2. Cough - likely reactive airway dysfunction syndrome - Lung function deferred today. - Hold the Spiriva one puff once daily (can increase to one puff twice daily if you tolerate it). - Start Flovent 08-21-1991 two puffs twice daily with a spacer (inhaled steroid to help control mucous production and inflammation) - Start albuterol 2 puffs every 4-6 hours as needed during coughing episodes to see if this helps. - We will see how you are doing at the next visit.   3. Reflux  - Continue with Pepcid (famotidine) 20mg  twice daily as needed.   4. Follow up in one week for repeat allergy testing at 8:30am.   Please inform of any Emergency Department visits, hospitalizations, or changes in symptoms. Call before going to the ED for breathing or allergy symptoms since we might be able to fit you in for a sick visit. Feel free to contact us anytime with any questions, problems, or concerns.  It was a pleasure to see you again today!  Websites that have reliable patient information: 1. American Academy of Asthma, Allergy, and Immunology: www.aaaai.org 2. Food Allergy Research and Education (FARE): foodallergy.org 3. Mothers of Asthmatics: http://www.asthmacommunitynetwork.org 4. American College of Allergy, Asthma, and Immunology: www.acaai.org   COVID-19 Vaccine Information can be found at: [place] For questions  related to vaccine distribution or appointments, please email [email or web address] or call [phone].     "Like" [email or web address] on Facebook and Instagram for our latest updates!      Make sure you are registered to vote! If you have moved or changed any of your contact information, you will need to get this updated before voting!  In some cases, you MAY be able to register to vote online: [phone]

## 2020-02-17 NOTE — Progress Notes (Signed)
FOLLOW UP  Date of Service/Encounter:  02/17/20   Assessment:   Reactive airways dysfunction syndrome- adding on ICS today   Seasonal and perennial allergic rhinitis(grasses, weeds, ragweed, trees, molds, cat, dog, cockroach, dust mite)- on allergen immunotherapy but poorly controlled (repeating skin testing in 1 week)  Gastroesophageal reflux disease  Possible drug allergy (PreserVision AREDS) - recommended discussing with ophthalmologist  Plan/Recommendations:   1. Chronic rhinitis (grasses, weeds, ragweed, trees, molds, cat, dog, cockroach, dust mite) - Continue with fluticasone 1-2 sprays per nostril daily + 1-2 sprays of azelastine nasal sprays per nostril daily - Continue with Xyzal 5mg  1-2 times daily (STOP FRIDGE NOVEMBER 12TH FOR TESTING).  - Agree with the addition of the Mucinex to with drying it up.  - Continue with allergy shots at the same schedule. - We are going to schedule you for repeat testing to see if we are missing something in your allergy shots.    2. Cough - likely reactive airway dysfunction syndrome - Lung function deferred today. - Hold the Spiriva one puff once daily (can increase to one puff twice daily if you tolerate it). - Start Flovent 08-21-1991 two puffs twice daily with a spacer (inhaled steroid to help control mucous production and inflammation) - Start albuterol 2 puffs every 4-6 hours as needed during coughing episodes to see if this helps. - We will see how you are doing at the next visit.   3. Reflux  - Continue with Pepcid (famotidine) 20mg  twice daily as needed.   4. Follow up in one week for repeat allergy testing at 8:30am.   Subjective:   [name] is a 69 y.o. female presenting today for follow up of  Chief Complaint  Patient presents with   Asthma   Allergic Rhinitis     [name] has a history of the following: Patient Active Problem List   Diagnosis Date Noted   Seasonal and perennial allergic  rhinitis 03/12/2017   Reactive airways dysfunction syndrome 03/12/2017   Gastroesophageal reflux disease 03/12/2017   Closed fracture of right tibial plateau 08/20/2016    History obtained from: chart review and patient.  [name] is a 69 y.o. female presenting for a follow up visit.  She was last seen in March 2021.  At that time, we continue with Flonase as well as azelastine and Xyzal.  We also continued her allergy shots.  Her cough was doing well with avoidance of her triggers.  Since the last visit, she has mostly done well. Her main complaint is a cough today.   Asthma/Respiratory Symptom History: She continues to cough a lot. The cough is all the time throughout the year. She has been on Spiriva in the past, but was only able to tolerate 1 puff once daily.  It is unclear what happened with 2 puffs twice once daily.  I have in my previous notes that she has been on albuterol, but she seems confused when I ask about it.  I reviewed her medication list and it is not there.  She has never been on any inhaled steroid including Flovent or Qvar.  I reviewed my previous notes and the cough seems to have gotten better with the use of allergen immunotherapy.  However, when asked her about it today, she thinks maybe her frequency of sinus infections is the only thing that has increased.  Allergic Rhinitis Symptom History: She remains on her allergen immunotherapy.  According to the result notes from her first visit,  she was positive to multiple intradermal's.  However, in my note, it does not have as many positives on her intradermal.  The testing results are not scanned into the system, either.  She is wondering if she is allergic to something else.  She is open to retesting to see where things are at this point in time.Marland Kitchen   She was put on PreserVision AREDS. She was started on this because of a family history of macular degeneraton.  Every time she took this medication, she developed nausea.  She did  not take it on an empty stomach.  She does take her Zyrtec in the morning as well.  However, this got worse and she eventually developed vomiting.  She finally made the connection between the ingestion of medication and her GI symptoms, and since she stopped taking it her symptoms have completely resolved.  This is a gel capsule.  She does have other pills that use a gel capsule, which she seems to tolerate without a problem.  She is wondering if she is having allergic reaction to this.  She has not talked to her ophthalmologist about this concern.  Otherwise, there have been no changes to her past medical history, surgical history, family history, or social history.    Review of Systems  Constitutional: Negative.  Negative for chills, fever, malaise/fatigue and weight loss.  HENT: Negative for congestion, ear discharge, ear pain, sinus pain and sore throat.   Eyes: Negative for pain, discharge and redness.  Respiratory: Positive for cough. Negative for sputum production, shortness of breath and wheezing.   Cardiovascular: Negative.  Negative for chest pain and palpitations.  Gastrointestinal: Negative for abdominal pain, constipation, diarrhea, heartburn, nausea and vomiting.  Skin: Negative.  Negative for itching and rash.  Neurological: Negative for dizziness and headaches.  Endo/Heme/Allergies: Negative for environmental allergies. Does not bruise/bleed easily.       Objective:   Blood pressure 112/80, pulse 64, temperature (!) 97.4 F (36.3 C), temperature source Temporal, resp. rate 16, height 5\' 3"  (1.6 m), weight 160 lb 6.4 oz (72.8 kg), SpO2 95 %. Body mass index is 28.41 kg/m.   Physical Exam:  Physical Exam Constitutional:      Appearance: She is well-developed.     Comments: Pleasant female.  Somewhat hoarse.  HENT:     Head: Normocephalic and atraumatic.     Right Ear: Tympanic membrane, ear canal and external ear normal.     Left Ear: Tympanic membrane, ear canal  and external ear normal.     Nose: No nasal deformity, septal deviation, mucosal edema or rhinorrhea.     Right Turbinates: Enlarged and swollen.     Left Turbinates: Enlarged and swollen.     Right Sinus: No maxillary sinus tenderness or frontal sinus tenderness.     Left Sinus: No maxillary sinus tenderness or frontal sinus tenderness.     Mouth/Throat:     Mouth: Mucous membranes are not pale and not dry.     Pharynx: Uvula midline.     Comments: Cobblestoning in posterior oropharynx. Eyes:     General:        Right eye: No discharge.        Left eye: No discharge.     Conjunctiva/sclera: Conjunctivae normal.     Right eye: Right conjunctiva is not injected. No chemosis.    Left eye: Left conjunctiva is not injected. No chemosis.    Pupils: Pupils are equal, round, and reactive to light.  Cardiovascular:  Rate and Rhythm: Normal rate and regular rhythm.     Heart sounds: Normal heart sounds.  Pulmonary:     Effort: Pulmonary effort is normal. No tachypnea, accessory muscle usage or respiratory distress.     Breath sounds: Normal breath sounds. No wheezing, rhonchi or rales.     Comments: Moving air well in all lung fields.  No increased work of breathing. Chest:     Chest wall: No tenderness.  Lymphadenopathy:     Cervical: No cervical adenopathy.  Skin:    Coloration: Skin is not pale.     Findings: No abrasion, erythema, petechiae or rash. Rash is not papular, urticarial or vesicular.  Neurological:     Mental Status: She is alert.      Diagnostic studies: none     Malachi Bonds, MD  [facility]

## 2020-02-24 ENCOUNTER — Ambulatory Visit: Payer: Medicare Other | Admitting: Allergy & Immunology

## 2020-03-07 DIAGNOSIS — J3081 Allergic rhinitis due to animal (cat) (dog) hair and dander: Secondary | ICD-10-CM | POA: Diagnosis not present

## 2020-03-07 NOTE — Progress Notes (Signed)
Vials exp 03-07-21 

## 2020-03-08 DIAGNOSIS — M810 Age-related osteoporosis without current pathological fracture: Secondary | ICD-10-CM | POA: Diagnosis not present

## 2020-03-08 DIAGNOSIS — E7849 Other hyperlipidemia: Secondary | ICD-10-CM | POA: Diagnosis not present

## 2020-03-08 DIAGNOSIS — M19012 Primary osteoarthritis, left shoulder: Secondary | ICD-10-CM | POA: Diagnosis not present

## 2020-03-08 DIAGNOSIS — J3089 Other allergic rhinitis: Secondary | ICD-10-CM | POA: Diagnosis not present

## 2020-03-16 ENCOUNTER — Ambulatory Visit: Payer: Medicare Other | Admitting: Allergy & Immunology

## 2020-03-30 ENCOUNTER — Ambulatory Visit (INDEPENDENT_AMBULATORY_CARE_PROVIDER_SITE_OTHER): Payer: Medicare Other

## 2020-03-30 DIAGNOSIS — J309 Allergic rhinitis, unspecified: Secondary | ICD-10-CM

## 2020-04-19 DIAGNOSIS — H52203 Unspecified astigmatism, bilateral: Secondary | ICD-10-CM | POA: Diagnosis not present

## 2020-04-19 DIAGNOSIS — H524 Presbyopia: Secondary | ICD-10-CM | POA: Diagnosis not present

## 2020-04-19 DIAGNOSIS — H43813 Vitreous degeneration, bilateral: Secondary | ICD-10-CM | POA: Diagnosis not present

## 2020-04-19 DIAGNOSIS — H2513 Age-related nuclear cataract, bilateral: Secondary | ICD-10-CM | POA: Diagnosis not present

## 2020-04-20 ENCOUNTER — Ambulatory Visit (INDEPENDENT_AMBULATORY_CARE_PROVIDER_SITE_OTHER): Payer: Medicare Other

## 2020-04-20 DIAGNOSIS — J309 Allergic rhinitis, unspecified: Secondary | ICD-10-CM | POA: Diagnosis not present

## 2020-04-22 ENCOUNTER — Ambulatory Visit (INDEPENDENT_AMBULATORY_CARE_PROVIDER_SITE_OTHER): Payer: Medicare Other | Admitting: Allergy & Immunology

## 2020-04-22 ENCOUNTER — Other Ambulatory Visit: Payer: Self-pay

## 2020-04-22 ENCOUNTER — Encounter: Payer: Self-pay | Admitting: Allergy & Immunology

## 2020-04-22 VITALS — BP 126/88 | HR 66 | Temp 97.5°F | Resp 18 | Ht 63.78 in | Wt 160.8 lb

## 2020-04-22 DIAGNOSIS — K219 Gastro-esophageal reflux disease without esophagitis: Secondary | ICD-10-CM

## 2020-04-22 DIAGNOSIS — J3089 Other allergic rhinitis: Secondary | ICD-10-CM | POA: Diagnosis not present

## 2020-04-22 DIAGNOSIS — J683 Other acute and subacute respiratory conditions due to chemicals, gases, fumes and vapors: Secondary | ICD-10-CM | POA: Diagnosis not present

## 2020-04-22 DIAGNOSIS — J302 Other seasonal allergic rhinitis: Secondary | ICD-10-CM | POA: Diagnosis not present

## 2020-04-22 NOTE — Patient Instructions (Addendum)
1. Chronic rhinitis (grasses, weeds, ragweed, trees, molds, cat, dog, cockroach, dust mite) - Testing was positive to grasses, weeds, trees, most mold mixes, and cat. - You lost dust mites, cockroach, and dog.  - We are going to remix your vials and increase the molds in your vials to cover this more. - Continue with fluticasone 1-2 sprays per nostril daily + 1-2 sprays of azelastine nasal sprays per nostril daily - Continue with Xyzal 5mg  1-2 times daily. - Add on Singulair (montelukast) 10mg  daily (this is the one that can cause irritability/bad dreams).  - We will remix your allergy shots and you can start those in a couple of weeks once it is mixed.    2. Cough - likely reactive airway dysfunction syndrome - Lung function deferred today. - We are not going to continue with Flovent 2 puffs twice daily with the spacer (long tube). - Continue with albuterol 2 puffs every 4 hours as needed.   3. Reflux  - Continue with Pepcid (famotidine) 20mg  twice daily as needed.  - Add on the omeprazole when it gets particularly bad.   4. Return in about 4 months (around 08/20/2020).    Please inform of any Emergency Department visits, hospitalizations, or changes in symptoms. Call before going to the ED for breathing or allergy symptoms since we might be able to fit you in for a sick visit. Feel free to contact 08/22/2020 anytime with any questions, problems, or concerns.  It was a pleasure to see you again today!  Websites that have reliable patient information: 1. American Academy of Asthma, Allergy, and Immunology: www.aaaai.org 2. Food Allergy Research and Education (FARE): foodallergy.org 3. Mothers of Asthmatics: http://www.asthmacommunitynetwork.org 4. American College of Allergy, Asthma, and Immunology: www.acaai.org   COVID-19 Vaccine Information can be found at: [place] For questions related to vaccine distribution or  appointments, please email [email or web address] or call [phone].     "Like" [place] on Facebook and Instagram for our latest updates!       Make sure you are registered to vote! If you have moved or changed any of your contact information, you will need to get this updated before voting!  In some cases, you MAY be able to register to vote online: [email or web address]

## 2020-04-22 NOTE — Progress Notes (Signed)
FOLLOW UP  Date of Service/Encounter:  04/22/20   Assessment:   Moderate persistent reactive airways dysfunction syndrome  Seasonal and perennial allergic rhinitis (grasses, weeds, trees, most mold mixes, and cat)  Gastroesophageal reflux disease  Plan/Recommendations:   1. Chronic rhinitis (grasses, weeds, ragweed, trees, molds, cat, dog, cockroach, dust mite) - Testing was positive to grasses, weeds, trees, most mold mixes, and cat. - You lost dust mites, cockroach, and dog.  - We are going to remix your vials and increase the molds in your vials to cover this more. - Continue with fluticasone 1-2 sprays per nostril daily + 1-2 sprays of azelastine nasal sprays per nostril daily - Continue with Xyzal 5mg  1-2 times daily. - Add on Singulair (montelukast) 10mg  daily (this is the one that can cause irritability/bad dreams).  - We will remix your allergy shots and you can start those in a couple of weeks once it is mixed.    2. Cough - likely reactive airway dysfunction syndrome - Lung function deferred today. - We are not going to continue with Flovent 2 puffs twice daily with the spacer (long tube). - Continue with albuterol 2 puffs every 4 hours as needed.   3. Reflux  - Continue with Pepcid (famotidine) 20mg  twice daily as needed.  - Add on the omeprazole when it gets particularly bad.   4. Return in about 4 months (around 08/20/2020).   Subjective:   [name] is a 70 y.o. female presenting today for follow up of  Chief Complaint  Patient presents with  . Allergy Testing    [name] has a history of the following: Patient Active Problem List   Diagnosis Date Noted  . Seasonal and perennial allergic rhinitis 03/12/2017  . Reactive airways dysfunction syndrome 03/12/2017  . Gastroesophageal reflux disease 03/12/2017  . Closed fracture of right tibial plateau 08/20/2016    History obtained from: chart review and patient.  [name] is a 70 y.o.  female presenting for a follow up visit. She was last seen in November 2021. At that time, we did not do lung function testing. We held the Spiriva and started Flovent two puffs twice daily with a spacer. We also started albuterol 2 puffs every 4-6 hours as needed during coughing episodes. Her history of asthma has never been entirely clear, but we have been treating it as such to see how she responds to it. We continued with her Pepcid BID for her reflux. Her environmental allergies did not seem well controlled. We continued with fluticasone as well as levocetirizine. We also made the decision to re-test her to see if we can re-mix her shots to make them more effective.  Since the last visit, she has done well. She did hold her Xyzal for this appointment today.    Asthma/Respiratory Symptom History: She has been using the spacer with the Flovent two puffs twice daily. She thinks this is helping quite a bit, but her history is rather vague. Natilee's asthma has been well controlled. She has not required rescue medication, experienced nocturnal awakenings due to lower respiratory symptoms, nor have activities of daily living been limited. She has required no Emergency Department or Urgent Care visits for her asthma. She has required zero courses of systemic steroids for asthma exacerbations since the last visit. ACT score today is 25, indicating excellent asthma symptom control.    Allergic Rhinitis Symptom History: aFll is her worse season. Moldly wet environments are bad, too. She has not  needed antibiotics at all since the last visit. She has been using her nasal sprays. She has issues when she rakes leaves, especially if they are wet.   She has a history of osteopenia. She does get regular bone scans. She is taking the vitamin D3. She is also on K2MK7 (over the counter supplement), which purportedly helps the D3 to get absorbed more effectively.    Otherwise, there have been no changes to her past  medical history, surgical history, family history, or social history.    Review of Systems  Constitutional: Negative.  Negative for chills, fever, malaise/fatigue and weight loss.  HENT: Positive for congestion. Negative for ear discharge, ear pain and sinus pain.   Eyes: Negative for pain, discharge and redness.  Respiratory: Negative for cough, sputum production, shortness of breath and wheezing.   Cardiovascular: Negative.  Negative for chest pain and palpitations.  Gastrointestinal: Negative for abdominal pain, constipation, diarrhea, heartburn, nausea and vomiting.  Skin: Negative.  Negative for itching and rash.  Neurological: Negative for dizziness and headaches.  Endo/Heme/Allergies: Positive for environmental allergies. Does not bruise/bleed easily.       Objective:   Blood pressure 126/88, pulse 66, temperature (!) 97.5 F (36.4 C), temperature source Temporal, resp. rate 18, height 5' 3.78" (1.62 m), weight 160 lb 12.8 oz (72.9 kg), SpO2 97 %. Body mass index is 27.79 kg/m.   Physical Exam:  Physical Exam Constitutional:      Appearance: She is well-developed.  HENT:     Head: Normocephalic and atraumatic.     Right Ear: Tympanic membrane, ear canal and external ear normal.     Left Ear: Tympanic membrane, ear canal and external ear normal.     Nose: No nasal deformity, septal deviation, mucosal edema, rhinorrhea or epistaxis.     Right Turbinates: Enlarged and swollen.     Left Turbinates: Enlarged and swollen.     Right Sinus: No maxillary sinus tenderness or frontal sinus tenderness.     Left Sinus: No maxillary sinus tenderness or frontal sinus tenderness.     Comments: No polyps appreciated.     Mouth/Throat:     Mouth: Oropharynx is clear and moist. Mucous membranes are not pale and not dry.     Pharynx: Uvula midline.  Eyes:     General:        Right eye: No discharge.        Left eye: No discharge.     Extraocular Movements: EOM normal.      Conjunctiva/sclera: Conjunctivae normal.     Right eye: Right conjunctiva is not injected. No chemosis.    Left eye: Left conjunctiva is not injected. No chemosis.    Pupils: Pupils are equal, round, and reactive to light.  Cardiovascular:     Rate and Rhythm: Normal rate and regular rhythm.     Heart sounds: Normal heart sounds.  Pulmonary:     Effort: Pulmonary effort is normal. No tachypnea, accessory muscle usage or respiratory distress.     Breath sounds: Normal breath sounds. No wheezing, rhonchi or rales.     Comments: Moving air well in all lung fields. No increased work of breathing noted.  Chest:     Chest wall: No tenderness.  Lymphadenopathy:     Cervical: No cervical adenopathy.  Skin:    Coloration: Skin is not pale.     Findings: No abrasion, erythema, petechiae or rash. Rash is not papular, urticarial or vesicular.  Neurological:  Mental Status: She is alert.  Psychiatric:        Mood and Affect: Mood and affect normal.      Diagnostic studies:    Allergy Studies:     Intradermal - 04/22/20 0900    Time Antigen Placed 0945    Allergen Manufacturer Waynette Buttery    Location Arm    Number of Test 15    Control Negative    [place] Negative    [place] Negative    7 Grass 1+    Ragweed mix 1+    Weed mix 2+    Tree mix 3+    Mold 1 Negative    Mold 2 3+    Mold 3 2+    Mold 4 2+    Cat 1+    Dog Negative    Cockroach Negative    Mite mix Negative            Allergy testing results were read and interpreted by myself, documented by clinical staff.      Malachi Bonds, MD  [facility]

## 2020-04-23 ENCOUNTER — Encounter: Payer: Self-pay | Admitting: Allergy & Immunology

## 2020-04-26 NOTE — Progress Notes (Signed)
Aeroallergen Immunotherapy (**NOTE NEW SCRIPT**)    Patient Details  Name: PUNAM BROUSSARD  MRN: [id number]  Date of Birth: 10/08/1950   Order 2 of 2   Vial Label: G/W/T/Cat   0.3 ml (Volume) BAU Concentration -- 7 Grass Mix* 100,000 ([address] [place], [place], [place], [place], [place], Sweet [place], [place])  0.5 ml (Volume) 1:20 Concentration -- Weed Mix*  0.5 ml (Volume) 1:20 Concentration -- Eastern 10 Tree Mix (also Sweet Gum)  0.5 ml (Volume) 1:10 Concentration -- Cat Hair    1.8 ml Extract Subtotal  3.2 ml Diluent  5.0 ml Maintenance Total    Final Concentration above is stated in weight/volume (wt/vol). Allergen units (AU/ml) biological units (BAU/ml). The total volume is 5 ml.    Schedule: A   Special Instructions: Start at [employer] 0.05 mL and advance on Schedule A.

## 2020-04-26 NOTE — Progress Notes (Signed)
Aeroallergen Immunotherapy (**NOTE NEW SCRIPT**)    Patient Details  Name: [name]  MRN: [id number]  Date of Birth: [birth year]   Order 1 of 2   Vial Label: Molds, RW   0.3 ml (Volume) 1:20 Concentration -- Ragweed Mix  0.3 ml (Volume) 1:10 Concentration -- Aspergillus mix  0.3 ml (Volume) 1:10 Concentration -- Penicillium mix  0.3 ml (Volume) 1:20 Concentration -- Bipolaris sorokiniana  0.3 ml (Volume) 1:20 Concentration -- Drechslera spicifera  0.3 ml (Volume) 1:10 Concentration -- Mucor plumbeus  0.3 ml (Volume) 1:10 Concentration -- Fusarium moniliforme  0.3 ml (Volume) 1:40 Concentration -- Aureobasidium pullulans  0.3 ml (Volume) 1:10 Concentration -- Rhizopus oryzae    2.7 ml Extract Subtotal  2.3 ml Diluent  5.0 ml Maintenance Total    Final Concentration above is stated in weight/volume (wt/vol). Allergen units (AU/ml) biological units (BAU/ml). The total volume is 5 ml.    Schedule: A   Special Instructions: Start at the [employer] and advance on Schedule A.

## 2020-04-27 DIAGNOSIS — J302 Other seasonal allergic rhinitis: Secondary | ICD-10-CM | POA: Diagnosis not present

## 2020-04-28 DIAGNOSIS — J3081 Allergic rhinitis due to animal (cat) (dog) hair and dander: Secondary | ICD-10-CM | POA: Diagnosis not present

## 2020-04-28 DIAGNOSIS — B351 Tinea unguium: Secondary | ICD-10-CM | POA: Diagnosis not present

## 2020-04-28 DIAGNOSIS — M79671 Pain in right foot: Secondary | ICD-10-CM | POA: Diagnosis not present

## 2020-04-28 DIAGNOSIS — M79674 Pain in right toe(s): Secondary | ICD-10-CM | POA: Diagnosis not present

## 2020-04-28 NOTE — Progress Notes (Signed)
NEW RX.  VIALS EXP 04-28-21

## 2020-05-11 DIAGNOSIS — B349 Viral infection, unspecified: Secondary | ICD-10-CM | POA: Diagnosis not present

## 2020-05-16 ENCOUNTER — Other Ambulatory Visit: Payer: Self-pay

## 2020-05-16 ENCOUNTER — Ambulatory Visit
Admission: EM | Admit: 2020-05-16 | Discharge: 2020-05-16 | Disposition: A | Discharge status: Home / Self Care | Payer: Medicare Other | Attending: Emergency Medicine | Admitting: Emergency Medicine

## 2020-05-16 DIAGNOSIS — Z1152 Encounter for screening for COVID-19: Secondary | ICD-10-CM

## 2020-05-16 NOTE — ED Triage Notes (Signed)
Patient presents to Urgent Care for COVID testing only. Pt states she has a hx of allergies however her husband tested positive for covid. 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 She declines seeing a provider today.

## 2020-05-17 LAB — NOVEL CORONAVIRUS, NAA: SARS-CoV-2, NAA: NOT DETECTED

## 2020-05-17 LAB — SARS-COV-2, NAA 2 DAY TAT

## 2020-06-08 ENCOUNTER — Ambulatory Visit (INDEPENDENT_AMBULATORY_CARE_PROVIDER_SITE_OTHER): Payer: Medicare Other

## 2020-06-08 DIAGNOSIS — J309 Allergic rhinitis, unspecified: Secondary | ICD-10-CM | POA: Diagnosis not present

## 2020-06-15 ENCOUNTER — Ambulatory Visit (INDEPENDENT_AMBULATORY_CARE_PROVIDER_SITE_OTHER): Payer: Medicare Other

## 2020-06-15 DIAGNOSIS — J309 Allergic rhinitis, unspecified: Secondary | ICD-10-CM

## 2020-06-29 ENCOUNTER — Ambulatory Visit (INDEPENDENT_AMBULATORY_CARE_PROVIDER_SITE_OTHER): Payer: Medicare Other

## 2020-06-29 DIAGNOSIS — J309 Allergic rhinitis, unspecified: Secondary | ICD-10-CM

## 2020-07-06 ENCOUNTER — Ambulatory Visit (INDEPENDENT_AMBULATORY_CARE_PROVIDER_SITE_OTHER): Payer: Medicare Other

## 2020-07-06 DIAGNOSIS — J309 Allergic rhinitis, unspecified: Secondary | ICD-10-CM

## 2020-07-06 DIAGNOSIS — E7849 Other hyperlipidemia: Secondary | ICD-10-CM | POA: Diagnosis not present

## 2020-07-06 DIAGNOSIS — J45909 Unspecified asthma, uncomplicated: Secondary | ICD-10-CM | POA: Diagnosis not present

## 2020-07-18 DIAGNOSIS — H906 Mixed conductive and sensorineural hearing loss, bilateral: Secondary | ICD-10-CM | POA: Diagnosis not present

## 2020-07-18 DIAGNOSIS — H6983 Other specified disorders of Eustachian tube, bilateral: Secondary | ICD-10-CM | POA: Diagnosis not present

## 2020-07-20 ENCOUNTER — Ambulatory Visit (INDEPENDENT_AMBULATORY_CARE_PROVIDER_SITE_OTHER): Payer: Medicare Other

## 2020-07-20 DIAGNOSIS — J309 Allergic rhinitis, unspecified: Secondary | ICD-10-CM

## 2020-07-27 ENCOUNTER — Ambulatory Visit (INDEPENDENT_AMBULATORY_CARE_PROVIDER_SITE_OTHER): Payer: Medicare Other | Admitting: *Deleted

## 2020-07-27 DIAGNOSIS — J309 Allergic rhinitis, unspecified: Secondary | ICD-10-CM | POA: Diagnosis not present

## 2020-08-03 ENCOUNTER — Ambulatory Visit (INDEPENDENT_AMBULATORY_CARE_PROVIDER_SITE_OTHER): Payer: Medicare Other

## 2020-08-03 DIAGNOSIS — J309 Allergic rhinitis, unspecified: Secondary | ICD-10-CM | POA: Diagnosis not present

## 2020-08-19 ENCOUNTER — Ambulatory Visit: Payer: Self-pay

## 2020-08-19 ENCOUNTER — Other Ambulatory Visit: Payer: Self-pay

## 2020-08-19 ENCOUNTER — Ambulatory Visit: Payer: Medicare Other | Admitting: Allergy & Immunology

## 2020-08-19 ENCOUNTER — Encounter: Payer: Self-pay | Admitting: Allergy & Immunology

## 2020-08-19 VITALS — BP 124/64 | HR 53 | Temp 96.6°F | Resp 16

## 2020-08-19 DIAGNOSIS — K219 Gastro-esophageal reflux disease without esophagitis: Secondary | ICD-10-CM

## 2020-08-19 DIAGNOSIS — J309 Allergic rhinitis, unspecified: Secondary | ICD-10-CM

## 2020-08-19 DIAGNOSIS — J683 Other acute and subacute respiratory conditions due to chemicals, gases, fumes and vapors: Secondary | ICD-10-CM | POA: Diagnosis not present

## 2020-08-19 DIAGNOSIS — J302 Other seasonal allergic rhinitis: Secondary | ICD-10-CM | POA: Diagnosis not present

## 2020-08-19 MED ORDER — MONTELUKAST SODIUM 10 MG PO TABS: 10.0000 mg | ORAL_TABLET | Freq: Every day | ORAL | 5 refills | Status: DC

## 2020-08-19 NOTE — Patient Instructions (Addendum)
1. Chronic rhinitis (grasses, weeds, ragweed, trees, molds, cat, dog, cockroach, dust mite) - Continue with allergy shots at the same schedule.  - Continue with fluticasone 1-2 sprays per nostril daily + 1-2 sprays of azelastine nasal sprays per nostril daily - Continue with Xyzal 5mg  1-2 times daily. - Start with Singulair (montelukast) 10mg  daily (this is the one that can cause irritability/bad dreams).    2. Cough - likely reactive airway dysfunction syndrome - Lung function deferred today. - Continue with albuterol 2 puffs every 4 hours as needed.  - Try using this before you eat to see if this helps at all.   3. Reflux  - Continue with Pepcid (famotidine) 20mg  twice daily as needed.  - Continue with omeprazole daily during particularly bad times.     4. Return in about 6 months (around 02/19/2021).     Please inform of any Emergency Department visits, hospitalizations, or changes in symptoms. Call before going to the ED for breathing or allergy symptoms since we might be able to fit you in for a sick visit. Feel free to contact 02/21/2021 anytime with any questions, problems, or concerns.  It was a pleasure to see you again today!  Websites that have reliable patient information: 1. American Academy of Asthma, Allergy, and Immunology: www.aaaai.org 2. Food Allergy Research and Education (FARE): foodallergy.org 3. Mothers of Asthmatics: http://www.asthmacommunitynetwork.org 4. American College of Allergy, Asthma, and Immunology: www.acaai.org   COVID-19 Vaccine Information can be found at: [place] For questions related to vaccine distribution or appointments, please email [email or web address] or call [phone].   We realize that you might be concerned about having an allergic reaction to the COVID19 vaccines. To help with that concern, WE ARE OFFERING THE COVID19 VACCINES IN OUR OFFICE! Ask the front desk for  dates!     "Like" [place] on Facebook and Instagram for our latest updates!      A healthy democracy works best when [email or web address] participate! Make sure you are registered to vote! If you have moved or changed any of your contact information, you will need to get this updated before voting!  In some cases, you MAY be able to register to vote online: [phone]

## 2020-08-19 NOTE — Progress Notes (Signed)
FOLLOW UP  Date of Service/Encounter:  08/19/20   Assessment:   Moderate persistent reactive airways dysfunction syndrome  Seasonal and perennial allergic rhinitis (grasses, weeds, trees, most mold mixes, and cat)  Gastroesophageal reflux disease   Plan/Recommendations:   1. Chronic rhinitis (grasses, weeds, ragweed, trees, molds, cat, dog, cockroach, dust mite) - Continue with allergy shots at the same schedule.  - Continue with fluticasone 1-2 sprays per nostril daily + 1-2 sprays of azelastine nasal sprays per nostril daily - Continue with Xyzal 5mg  1-2 times daily. - Start with Singulair (montelukast) 10mg  daily (this is the one that can cause irritability/bad dreams).    2. Cough - likely reactive airway dysfunction syndrome - Lung function deferred today. - Continue with albuterol 2 puffs every 4 hours as needed.  - Try using this before you eat to see if this helps at all.   3. Reflux  - Continue with Pepcid (famotidine) 20mg  twice daily as needed.  - Continue with omeprazole daily during particularly bad times.     4. Return in about 6 months (around 02/19/2021).    Subjective:   [name] is a 70 y.o. female presenting today for follow up of  Chief Complaint  Patient presents with  . Follow-up    [name] has a history of the following: Patient Active Problem List   Diagnosis Date Noted  . Seasonal and perennial allergic rhinitis 03/12/2017  . Reactive airways dysfunction syndrome 03/12/2017  . Gastroesophageal reflux disease 03/12/2017  . Closed fracture of right tibial plateau 08/20/2016    History obtained from: chart review and patient.  [name] is a 70 y.o. female presenting for a follow up visit.  She was last seen in January 2022.  At that time, she underwent repeat testing that was positive to grasses, weeds, trees, most mold mixes, and cat.  She lost her dust mites, cockroach, and dog sensitizations.  We decided to remix her  vials and increase the mold in her vials to cover more of the symptoms.  We continue with Flonase and Astelin up to twice daily.  We also continue with Xyzal.  We were supposed to add on Singulair, but apparently we never sent this in.  Her cough was doing fine with albuterol as needed.  We have tried a number of controller medications in the past and there did not seem to be any improvement in her symptoms.  For her reflux, we continue with Pepcid and added omeprazole during particularly bad times.  In the interim, she unfortunately has had quite a bit of social upheaval. She recently had COVID in January and February 2022 that passed through her entire family. Her mother ended up passing in February.   Asthma/Respiratory Symptom History: She uses her albuterol fairly rarely. She thinks that the cough might be getting better with the read mixing of the allergy shots.  She has not been to the emergency room or urgent care for breathing.  She has not required any systemic steroids.  Her coughing is inconsistent, but definitely tends to happen around mealtimes.  Allergic Rhinitis Symptom History: Shots are going fairly good. She thinks that her coughing spells are improving over time. She never did start the Singulair, but she is interested in doing so.  She has had no problems with increasing her allergy shots.  GERD Symptom History: She does have coughing immediately after eating and sometimes during.  She does not take omeprazole on a routine basis because of  the correlation with bone demineralization.  Evidently, she has a history of osteopenia anyway and does not want to make this any worse.  She does not take a bisphosphonate but instead takes vitamin D as well as another supplement that contains several vitamins that supposedly helps her absorb vitamin D better.  Regardless, she is never had a swallow study.  She can drink water and other liquids without any issues.  When she does have the issues with  coughing after eating, she also has some nasal congestion.  It's unclear whether this is just a spectrum that contains gustatory rhinitis.   Otherwise, there have been no changes to her past medical history, surgical history, family history, or social history.    Review of Systems  Constitutional: Negative.  Negative for chills, fever, malaise/fatigue and weight loss.  HENT: Positive for congestion. Negative for ear discharge and ear pain.   Eyes: Negative for pain, discharge and redness.  Respiratory: Positive for cough. Negative for sputum production, shortness of breath and wheezing.   Cardiovascular: Negative.  Negative for chest pain and palpitations.  Gastrointestinal: Negative for abdominal pain, constipation, diarrhea, heartburn, nausea and vomiting.  Skin: Negative.  Negative for itching and rash.  Neurological: Negative for dizziness and headaches.  Endo/Heme/Allergies: Negative for environmental allergies. Does not bruise/bleed easily.       Objective:   Blood pressure 124/64, pulse (!) 53, temperature (!) 96.6 F (35.9 C), temperature source Temporal, resp. rate 16, SpO2 96 %. There is no height or weight on file to calculate BMI.   Physical Exam:  Physical Exam Constitutional:      Appearance: She is well-developed.  HENT:     Head: Normocephalic and atraumatic.     Right Ear: Tympanic membrane, ear canal and external ear normal.     Left Ear: Tympanic membrane and ear canal normal.     Nose: No nasal deformity, septal deviation, mucosal edema or rhinorrhea.     Right Sinus: No maxillary sinus tenderness or frontal sinus tenderness.     Left Sinus: No maxillary sinus tenderness or frontal sinus tenderness.     Mouth/Throat:     Mouth: Mucous membranes are not pale and not dry.     Pharynx: Uvula midline.  Eyes:     General:        Right eye: No discharge.        Left eye: No discharge.     Conjunctiva/sclera: Conjunctivae normal.     Right eye: Right  conjunctiva is not injected. No chemosis.    Left eye: Left conjunctiva is not injected. No chemosis.    Pupils: Pupils are equal, round, and reactive to light.  Cardiovascular:     Rate and Rhythm: Normal rate and regular rhythm.     Heart sounds: Normal heart sounds.  Pulmonary:     Effort: Pulmonary effort is normal. No tachypnea, accessory muscle usage or respiratory distress.     Breath sounds: Normal breath sounds. No wheezing, rhonchi or rales.  Chest:     Chest wall: No tenderness.  Lymphadenopathy:     Cervical: No cervical adenopathy.  Skin:    Coloration: Skin is not pale.     Findings: No abrasion, erythema, petechiae or rash. Rash is not papular, urticarial or vesicular.  Neurological:     Mental Status: She is alert.      Diagnostic studies: none       Malachi Bonds, MD  [facility]

## 2020-08-24 ENCOUNTER — Ambulatory Visit (INDEPENDENT_AMBULATORY_CARE_PROVIDER_SITE_OTHER): Payer: Medicare Other

## 2020-08-24 DIAGNOSIS — J309 Allergic rhinitis, unspecified: Secondary | ICD-10-CM

## 2020-09-02 ENCOUNTER — Ambulatory Visit (INDEPENDENT_AMBULATORY_CARE_PROVIDER_SITE_OTHER): Payer: Medicare Other

## 2020-09-02 DIAGNOSIS — J309 Allergic rhinitis, unspecified: Secondary | ICD-10-CM | POA: Diagnosis not present

## 2020-09-06 DIAGNOSIS — M810 Age-related osteoporosis without current pathological fracture: Secondary | ICD-10-CM | POA: Diagnosis not present

## 2020-09-06 DIAGNOSIS — M19012 Primary osteoarthritis, left shoulder: Secondary | ICD-10-CM | POA: Diagnosis not present

## 2020-09-06 DIAGNOSIS — E7849 Other hyperlipidemia: Secondary | ICD-10-CM | POA: Diagnosis not present

## 2020-09-21 ENCOUNTER — Ambulatory Visit (INDEPENDENT_AMBULATORY_CARE_PROVIDER_SITE_OTHER): Payer: Medicare Other

## 2020-09-21 DIAGNOSIS — J309 Allergic rhinitis, unspecified: Secondary | ICD-10-CM | POA: Diagnosis not present

## 2020-09-30 ENCOUNTER — Ambulatory Visit (INDEPENDENT_AMBULATORY_CARE_PROVIDER_SITE_OTHER): Payer: Medicare Other

## 2020-09-30 DIAGNOSIS — J309 Allergic rhinitis, unspecified: Secondary | ICD-10-CM

## 2020-10-05 ENCOUNTER — Ambulatory Visit (INDEPENDENT_AMBULATORY_CARE_PROVIDER_SITE_OTHER): Payer: Medicare Other

## 2020-10-05 DIAGNOSIS — J309 Allergic rhinitis, unspecified: Secondary | ICD-10-CM | POA: Diagnosis not present

## 2020-10-06 DIAGNOSIS — M810 Age-related osteoporosis without current pathological fracture: Secondary | ICD-10-CM | POA: Diagnosis not present

## 2020-10-06 DIAGNOSIS — E7849 Other hyperlipidemia: Secondary | ICD-10-CM | POA: Diagnosis not present

## 2020-10-06 DIAGNOSIS — M19012 Primary osteoarthritis, left shoulder: Secondary | ICD-10-CM | POA: Diagnosis not present

## 2020-10-17 DIAGNOSIS — M79671 Pain in right foot: Secondary | ICD-10-CM | POA: Diagnosis not present

## 2020-10-17 DIAGNOSIS — M79674 Pain in right toe(s): Secondary | ICD-10-CM | POA: Diagnosis not present

## 2020-10-17 DIAGNOSIS — B351 Tinea unguium: Secondary | ICD-10-CM | POA: Diagnosis not present

## 2020-10-19 ENCOUNTER — Ambulatory Visit (INDEPENDENT_AMBULATORY_CARE_PROVIDER_SITE_OTHER): Payer: Medicare Other

## 2020-10-19 DIAGNOSIS — J309 Allergic rhinitis, unspecified: Secondary | ICD-10-CM | POA: Diagnosis not present

## 2020-10-19 DIAGNOSIS — Z1231 Encounter for screening mammogram for malignant neoplasm of breast: Secondary | ICD-10-CM | POA: Diagnosis not present

## 2020-10-21 DIAGNOSIS — H0014 Chalazion left upper eyelid: Secondary | ICD-10-CM | POA: Diagnosis not present

## 2020-10-26 ENCOUNTER — Ambulatory Visit (INDEPENDENT_AMBULATORY_CARE_PROVIDER_SITE_OTHER): Payer: Medicare Other | Admitting: *Deleted

## 2020-10-26 DIAGNOSIS — J309 Allergic rhinitis, unspecified: Secondary | ICD-10-CM

## 2020-11-11 DIAGNOSIS — J3089 Other allergic rhinitis: Secondary | ICD-10-CM | POA: Diagnosis not present

## 2020-11-11 DIAGNOSIS — J069 Acute upper respiratory infection, unspecified: Secondary | ICD-10-CM | POA: Diagnosis not present

## 2020-11-19 ENCOUNTER — Other Ambulatory Visit: Payer: Self-pay | Admitting: Allergy & Immunology

## 2020-11-21 DIAGNOSIS — R002 Palpitations: Secondary | ICD-10-CM | POA: Diagnosis not present

## 2020-11-21 DIAGNOSIS — R946 Abnormal results of thyroid function studies: Secondary | ICD-10-CM | POA: Diagnosis not present

## 2020-11-21 DIAGNOSIS — Z1389 Encounter for screening for other disorder: Secondary | ICD-10-CM | POA: Diagnosis not present

## 2020-11-21 DIAGNOSIS — R7309 Other abnormal glucose: Secondary | ICD-10-CM | POA: Diagnosis not present

## 2020-11-21 DIAGNOSIS — G43109 Migraine with aura, not intractable, without status migrainosus: Secondary | ICD-10-CM | POA: Diagnosis not present

## 2020-11-21 DIAGNOSIS — K219 Gastro-esophageal reflux disease without esophagitis: Secondary | ICD-10-CM | POA: Diagnosis not present

## 2020-11-21 DIAGNOSIS — Z0000 Encounter for general adult medical examination without abnormal findings: Secondary | ICD-10-CM | POA: Diagnosis not present

## 2020-11-21 DIAGNOSIS — M1991 Primary osteoarthritis, unspecified site: Secondary | ICD-10-CM | POA: Diagnosis not present

## 2020-11-21 DIAGNOSIS — E782 Mixed hyperlipidemia: Secondary | ICD-10-CM | POA: Diagnosis not present

## 2020-11-25 ENCOUNTER — Ambulatory Visit (INDEPENDENT_AMBULATORY_CARE_PROVIDER_SITE_OTHER): Payer: Medicare Other

## 2020-11-25 DIAGNOSIS — J309 Allergic rhinitis, unspecified: Secondary | ICD-10-CM | POA: Diagnosis not present

## 2020-11-28 DIAGNOSIS — M1812 Unilateral primary osteoarthritis of first carpometacarpal joint, left hand: Secondary | ICD-10-CM | POA: Diagnosis not present

## 2020-11-28 DIAGNOSIS — S52502A Unspecified fracture of the lower end of left radius, initial encounter for closed fracture: Secondary | ICD-10-CM | POA: Diagnosis not present

## 2020-11-28 DIAGNOSIS — M8588 Other specified disorders of bone density and structure, other site: Secondary | ICD-10-CM | POA: Diagnosis not present

## 2020-11-28 DIAGNOSIS — S59209A Unspecified physeal fracture of lower end of radius, unspecified arm, initial encounter for closed fracture: Secondary | ICD-10-CM | POA: Diagnosis not present

## 2020-11-28 DIAGNOSIS — S59202A Unspecified physeal fracture of lower end of radius, left arm, initial encounter for closed fracture: Secondary | ICD-10-CM | POA: Diagnosis not present

## 2020-11-28 DIAGNOSIS — Z885 Allergy status to narcotic agent status: Secondary | ICD-10-CM | POA: Diagnosis not present

## 2020-11-30 DIAGNOSIS — S52502A Unspecified fracture of the lower end of left radius, initial encounter for closed fracture: Secondary | ICD-10-CM | POA: Diagnosis not present

## 2020-12-02 ENCOUNTER — Ambulatory Visit (INDEPENDENT_AMBULATORY_CARE_PROVIDER_SITE_OTHER): Payer: Medicare Other

## 2020-12-02 DIAGNOSIS — J309 Allergic rhinitis, unspecified: Secondary | ICD-10-CM

## 2020-12-05 DIAGNOSIS — S52502D Unspecified fracture of the lower end of left radius, subsequent encounter for closed fracture with routine healing: Secondary | ICD-10-CM | POA: Diagnosis not present

## 2020-12-07 ENCOUNTER — Ambulatory Visit (INDEPENDENT_AMBULATORY_CARE_PROVIDER_SITE_OTHER): Payer: Medicare Other

## 2020-12-07 DIAGNOSIS — J309 Allergic rhinitis, unspecified: Secondary | ICD-10-CM | POA: Diagnosis not present

## 2020-12-08 DIAGNOSIS — S52502A Unspecified fracture of the lower end of left radius, initial encounter for closed fracture: Secondary | ICD-10-CM | POA: Diagnosis not present

## 2020-12-08 DIAGNOSIS — S52572A Other intraarticular fracture of lower end of left radius, initial encounter for closed fracture: Secondary | ICD-10-CM | POA: Diagnosis not present

## 2020-12-08 DIAGNOSIS — G8918 Other acute postprocedural pain: Secondary | ICD-10-CM | POA: Diagnosis not present

## 2020-12-19 DIAGNOSIS — S52502D Unspecified fracture of the lower end of left radius, subsequent encounter for closed fracture with routine healing: Secondary | ICD-10-CM | POA: Diagnosis not present

## 2020-12-21 ENCOUNTER — Ambulatory Visit (INDEPENDENT_AMBULATORY_CARE_PROVIDER_SITE_OTHER): Payer: Medicare Other | Admitting: *Deleted

## 2020-12-21 DIAGNOSIS — J309 Allergic rhinitis, unspecified: Secondary | ICD-10-CM | POA: Diagnosis not present

## 2020-12-28 ENCOUNTER — Ambulatory Visit (INDEPENDENT_AMBULATORY_CARE_PROVIDER_SITE_OTHER): Payer: Medicare Other

## 2020-12-28 DIAGNOSIS — J309 Allergic rhinitis, unspecified: Secondary | ICD-10-CM

## 2021-01-11 ENCOUNTER — Ambulatory Visit (INDEPENDENT_AMBULATORY_CARE_PROVIDER_SITE_OTHER): Payer: Medicare Other

## 2021-01-11 DIAGNOSIS — J309 Allergic rhinitis, unspecified: Secondary | ICD-10-CM | POA: Diagnosis not present

## 2021-01-16 DIAGNOSIS — S52502D Unspecified fracture of the lower end of left radius, subsequent encounter for closed fracture with routine healing: Secondary | ICD-10-CM | POA: Diagnosis not present

## 2021-01-18 ENCOUNTER — Ambulatory Visit (INDEPENDENT_AMBULATORY_CARE_PROVIDER_SITE_OTHER): Payer: Medicare Other

## 2021-01-18 DIAGNOSIS — J309 Allergic rhinitis, unspecified: Secondary | ICD-10-CM | POA: Diagnosis not present

## 2021-01-18 NOTE — Progress Notes (Signed)
VIALS MADE. EXP 01-18-22 

## 2021-01-19 DIAGNOSIS — J302 Other seasonal allergic rhinitis: Secondary | ICD-10-CM

## 2021-01-20 DIAGNOSIS — J3081 Allergic rhinitis due to animal (cat) (dog) hair and dander: Secondary | ICD-10-CM | POA: Diagnosis not present

## 2021-01-25 ENCOUNTER — Ambulatory Visit (INDEPENDENT_AMBULATORY_CARE_PROVIDER_SITE_OTHER): Payer: Medicare Other

## 2021-01-25 DIAGNOSIS — J309 Allergic rhinitis, unspecified: Secondary | ICD-10-CM

## 2021-02-01 ENCOUNTER — Ambulatory Visit (INDEPENDENT_AMBULATORY_CARE_PROVIDER_SITE_OTHER): Payer: Medicare Other

## 2021-02-01 DIAGNOSIS — J309 Allergic rhinitis, unspecified: Secondary | ICD-10-CM | POA: Diagnosis not present

## 2021-02-01 DIAGNOSIS — N390 Urinary tract infection, site not specified: Secondary | ICD-10-CM | POA: Diagnosis not present

## 2021-02-01 DIAGNOSIS — R8290 Unspecified abnormal findings in urine: Secondary | ICD-10-CM | POA: Diagnosis not present

## 2021-02-13 DIAGNOSIS — B351 Tinea unguium: Secondary | ICD-10-CM | POA: Diagnosis not present

## 2021-02-13 DIAGNOSIS — M79674 Pain in right toe(s): Secondary | ICD-10-CM | POA: Diagnosis not present

## 2021-02-13 DIAGNOSIS — M79671 Pain in right foot: Secondary | ICD-10-CM | POA: Diagnosis not present

## 2021-02-15 ENCOUNTER — Ambulatory Visit (INDEPENDENT_AMBULATORY_CARE_PROVIDER_SITE_OTHER): Payer: Medicare Other

## 2021-02-15 DIAGNOSIS — J309 Allergic rhinitis, unspecified: Secondary | ICD-10-CM | POA: Diagnosis not present

## 2021-02-22 ENCOUNTER — Ambulatory Visit: Payer: Medicare Other | Admitting: Allergy & Immunology

## 2021-02-22 ENCOUNTER — Other Ambulatory Visit: Payer: Self-pay

## 2021-02-22 VITALS — BP 132/74 | HR 58 | Temp 97.5°F | Resp 18 | Ht 63.0 in | Wt 171.0 lb

## 2021-02-22 DIAGNOSIS — J683 Other acute and subacute respiratory conditions due to chemicals, gases, fumes and vapors: Secondary | ICD-10-CM | POA: Diagnosis not present

## 2021-02-22 DIAGNOSIS — J302 Other seasonal allergic rhinitis: Secondary | ICD-10-CM | POA: Diagnosis not present

## 2021-02-22 DIAGNOSIS — K219 Gastro-esophageal reflux disease without esophagitis: Secondary | ICD-10-CM | POA: Diagnosis not present

## 2021-02-22 DIAGNOSIS — J3089 Other allergic rhinitis: Secondary | ICD-10-CM

## 2021-02-22 DIAGNOSIS — J309 Allergic rhinitis, unspecified: Secondary | ICD-10-CM | POA: Diagnosis not present

## 2021-02-22 MED ORDER — MONTELUKAST SODIUM 10 MG PO TABS: 10.0000 mg | ORAL_TABLET | Freq: Every day | ORAL | 3 refills | Status: DC

## 2021-02-22 NOTE — Patient Instructions (Addendum)
1. Chronic rhinitis (grasses, weeds, ragweed, trees, molds, cat, dog, cockroach, dust mite) - Continue with allergy shots at the same schedule.  - Continue with fluticasone 1-2 sprays per nostril daily + 1-2 sprays of azelastine nasal sprays per nostril daily as needed.  - Continue with Xyzal 5mg  1-2 times daily as needed.  - Continue with Singulair (montelukast) 10mg  daily.  2. Cough - likely reactive airway dysfunction syndrome - Lung function looked great today.  - Continue with Spiriva two puffs once daily.  - Continue with albuterol 2 puffs every 4 hours as needed.   3. Reflux  - Continue with Pepcid (famotidine) 20mg  twice daily as needed.  - Continue with omeprazole daily during particularly bad times.    - Continue with Tums as needed.  4. Return in about 6 months (around 08/22/2021).     Please inform of any Emergency Department visits, hospitalizations, or changes in symptoms. Call before going to the ED for breathing or allergy symptoms since we might be able to fit you in for a sick visit. Feel free to contact 08/24/2021 anytime with any questions, problems, or concerns.  It was a pleasure to see you again today!  Websites that have reliable patient information: 1. American Academy of Asthma, Allergy, and Immunology: www.aaaai.org 2. Food Allergy Research and Education (FARE): foodallergy.org 3. Mothers of Asthmatics: http://www.asthmacommunitynetwork.org 4. American College of Allergy, Asthma, and Immunology: www.acaai.org   COVID-19 Vaccine Information can be found at: [place] For questions related to vaccine distribution or appointments, please email [email or web address] or call [phone].   We realize that you might be concerned about having an allergic reaction to the COVID19 vaccines. To help with that concern, WE ARE OFFERING THE COVID19 VACCINES IN OUR OFFICE! Ask the front desk for dates!      "Like" [place] on Facebook and Instagram for our latest updates!      A healthy democracy works best when [email or web address] participate! Make sure you are registered to vote! If you have moved or changed any of your contact information, you will need to get this updated before voting!  In some cases, you MAY be able to register to vote online: [phone]

## 2021-02-22 NOTE — Progress Notes (Signed)
FOLLOW UP  Date of Service/Encounter:  02/22/21   Assessment:   Moderate persistent reactive airways dysfunction syndrome   Seasonal and perennial allergic rhinitis (grasses, weeds, trees, most mold mixes, and cat)   Gastroesophageal reflux disease  Plan/Recommendations:   1. Chronic rhinitis (grasses, weeds, ragweed, trees, molds, cat, dog, cockroach, dust mite) - Continue with allergy shots at the same schedule.  - Continue with fluticasone 1-2 sprays per nostril daily + 1-2 sprays of azelastine nasal sprays per nostril daily as needed.  - Continue with Xyzal 5mg  1-2 times daily as needed.  - Continue with Singulair (montelukast) 10mg  daily.  2. Cough - likely reactive airway dysfunction syndrome - Lung function looked great today.  - Continue with Spiriva two puffs once daily.  - Continue with albuterol 2 puffs every 4 hours as needed.   3. Reflux  - Continue with Pepcid (famotidine) 20mg  twice daily as needed.  - Continue with omeprazole daily during particularly bad times.    - Continue with Tums as needed.  4. Return in about 6 months (around 08/22/2021).    Subjective:   [name] is a 70 y.o. female presenting today for follow up of  Chief Complaint  Patient presents with   Follow-up    Patient in today for a follow up and she has been doing better until the weather change and she started to have a few mild symptoms.    [name] has a history of the following: Patient Active Problem List   Diagnosis Date Noted   Seasonal and perennial allergic rhinitis 03/12/2017   Reactive airways dysfunction syndrome 03/12/2017   Gastroesophageal reflux disease 03/12/2017   Closed fracture of right tibial plateau 08/20/2016    History obtained from: chart review and patient.  [name] is a 70 y.o. female presenting for a follow up visit. She was last seen in May 2022. At that time, we continued with allergy shots at the same schedule. We also continued  with fluticasone as well as levocetirizine and montelukast.  For her cough, her lung function was not done.  We just continue with albuterol 2 puffs every 4 hours as needed.  Her reflux was controlled with famotidine as well as omeprazole.  Since last visit, she has mostly done well. She did have an episode where she broke her wrist. She was in the ED and she developed an intense cough. She was having some issues answering the questions in the ED. She was unable to catch her breath. She was coughing terribly and her eyes and nose were rather runny. She has never been that bad at all. She ended up getting better when she got out of that environment. She did have surgery but it is still on "right". She is going to follow up with the orthopedic doctor soon.   Asthma/Respiratory Symptom History: She did have 3-4 months of much better symptoms.  She has been using her Spiriva two puffs once daily. She is also on the montelukast which seems to help.  She does not refill her albuterol at all which is a good sign.   Allergic Rhinitis Symptom History: She is tolerating her allergy shots without a problem. Sometimes there will be some puffiness around where the injection is. It does not get larger or spread.   GERD Symptom History: Reflux is under fair control with her omeprazole and famotidine. Both  are used on a PRN basis. She uses chewable Tums occasionally as well.   Otherwise,  there have been no changes to her past medical history, surgical history, family history, or social history.    Review of Systems  Constitutional: Negative.  Negative for chills, fever, malaise/fatigue and weight loss.  HENT: Negative.  Negative for congestion, ear discharge and ear pain.   Eyes:  Negative for pain, discharge and redness.  Respiratory:  Negative for cough, sputum production, shortness of breath and wheezing.   Cardiovascular: Negative.  Negative for chest pain and palpitations.  Gastrointestinal:  Negative for  abdominal pain, diarrhea, heartburn, nausea and vomiting.  Skin: Negative.  Negative for itching and rash.  Neurological:  Negative for dizziness and headaches.  Endo/Heme/Allergies:  Negative for environmental allergies. Does not bruise/bleed easily.      Objective:   Blood pressure 132/74, pulse (!) 58, temperature (!) 97.5 F (36.4 C), temperature source Temporal, resp. rate 18, height 5\' 3"  (1.6 m), weight 171 lb (77.6 kg), SpO2 96 %. Body mass index is 30.29 kg/m.   Physical Exam:  Physical Exam Vitals reviewed.  Constitutional:      Appearance: She is well-developed.     Comments: Smiling.  HENT:     Head: Normocephalic and atraumatic.     Right Ear: Tympanic membrane, ear canal and external ear normal.     Left Ear: Tympanic membrane, ear canal and external ear normal.     Nose: No nasal deformity, septal deviation, mucosal edema or rhinorrhea.     Right Turbinates: Enlarged and swollen.     Left Turbinates: Enlarged and swollen.     Right Sinus: No maxillary sinus tenderness or frontal sinus tenderness.     Left Sinus: No maxillary sinus tenderness or frontal sinus tenderness.     Mouth/Throat:     Mouth: Mucous membranes are not pale and not dry.     Pharynx: Uvula midline.  Eyes:     General:        Right eye: No discharge.        Left eye: No discharge.     Conjunctiva/sclera: Conjunctivae normal.     Right eye: Right conjunctiva is not injected. No chemosis.    Left eye: Left conjunctiva is not injected. No chemosis.    Pupils: Pupils are equal, round, and reactive to light.  Cardiovascular:     Rate and Rhythm: Normal rate and regular rhythm.     Heart sounds: Normal heart sounds.  Pulmonary:     Effort: Pulmonary effort is normal. No tachypnea, accessory muscle usage or respiratory distress.     Breath sounds: Normal breath sounds. No wheezing, rhonchi or rales.     Comments: Moving air well in all lung fields.  No increased work of breathing.  Occasional  hoarseness, but nothing like it normally is. Chest:     Chest wall: No tenderness.  Lymphadenopathy:     Cervical: No cervical adenopathy.  Skin:    Coloration: Skin is not pale.     Findings: No abrasion, erythema, petechiae or rash. Rash is not papular, urticarial or vesicular.  Neurological:     Mental Status: She is alert.  Psychiatric:        Behavior: Behavior is cooperative.     Diagnostic studies:    Spirometry: results normal (FEV1: 2.08/96%, FVC: 2.72/97%, FEV1/FVC: 76%).    Spirometry consistent with normal pattern  Allergy Studies: none       09-[birth year]-1990, MD  [facility]

## 2021-02-23 ENCOUNTER — Encounter: Payer: Self-pay | Admitting: Allergy & Immunology

## 2021-02-23 ENCOUNTER — Other Ambulatory Visit: Payer: Self-pay | Admitting: Allergy & Immunology

## 2021-02-27 DIAGNOSIS — S52502D Unspecified fracture of the lower end of left radius, subsequent encounter for closed fracture with routine healing: Secondary | ICD-10-CM | POA: Diagnosis not present

## 2021-03-08 ENCOUNTER — Ambulatory Visit (INDEPENDENT_AMBULATORY_CARE_PROVIDER_SITE_OTHER): Payer: Medicare Other

## 2021-03-08 DIAGNOSIS — J309 Allergic rhinitis, unspecified: Secondary | ICD-10-CM

## 2021-03-15 ENCOUNTER — Ambulatory Visit (INDEPENDENT_AMBULATORY_CARE_PROVIDER_SITE_OTHER): Payer: Medicare Other

## 2021-03-15 DIAGNOSIS — J309 Allergic rhinitis, unspecified: Secondary | ICD-10-CM

## 2021-03-22 ENCOUNTER — Ambulatory Visit (INDEPENDENT_AMBULATORY_CARE_PROVIDER_SITE_OTHER): Payer: Medicare Other | Admitting: *Deleted

## 2021-03-22 DIAGNOSIS — J309 Allergic rhinitis, unspecified: Secondary | ICD-10-CM | POA: Diagnosis not present

## 2021-04-05 ENCOUNTER — Ambulatory Visit (INDEPENDENT_AMBULATORY_CARE_PROVIDER_SITE_OTHER): Payer: Medicare Other | Admitting: *Deleted

## 2021-04-05 DIAGNOSIS — J309 Allergic rhinitis, unspecified: Secondary | ICD-10-CM | POA: Diagnosis not present

## 2021-04-06 DIAGNOSIS — Z1211 Encounter for screening for malignant neoplasm of colon: Secondary | ICD-10-CM | POA: Diagnosis not present

## 2021-04-12 ENCOUNTER — Ambulatory Visit (INDEPENDENT_AMBULATORY_CARE_PROVIDER_SITE_OTHER): Payer: Medicare Other

## 2021-04-12 DIAGNOSIS — J309 Allergic rhinitis, unspecified: Secondary | ICD-10-CM | POA: Diagnosis not present

## 2021-04-19 ENCOUNTER — Ambulatory Visit (INDEPENDENT_AMBULATORY_CARE_PROVIDER_SITE_OTHER): Payer: Medicare Other

## 2021-04-19 DIAGNOSIS — J309 Allergic rhinitis, unspecified: Secondary | ICD-10-CM

## 2021-05-03 ENCOUNTER — Ambulatory Visit (INDEPENDENT_AMBULATORY_CARE_PROVIDER_SITE_OTHER): Payer: Medicare Other

## 2021-05-03 DIAGNOSIS — J309 Allergic rhinitis, unspecified: Secondary | ICD-10-CM

## 2021-05-03 MED ORDER — EPINEPHRINE 0.3 MG/0.3ML IJ SOAJ: 0.3000 mg | Freq: Once | INTRAMUSCULAR | 1 refills | Status: DC | PRN

## 2021-05-17 ENCOUNTER — Ambulatory Visit (INDEPENDENT_AMBULATORY_CARE_PROVIDER_SITE_OTHER): Payer: Medicare Other

## 2021-05-17 DIAGNOSIS — J309 Allergic rhinitis, unspecified: Secondary | ICD-10-CM

## 2021-05-30 DIAGNOSIS — H43813 Vitreous degeneration, bilateral: Secondary | ICD-10-CM | POA: Diagnosis not present

## 2021-05-30 DIAGNOSIS — H5213 Myopia, bilateral: Secondary | ICD-10-CM | POA: Diagnosis not present

## 2021-05-30 DIAGNOSIS — H2513 Age-related nuclear cataract, bilateral: Secondary | ICD-10-CM | POA: Diagnosis not present

## 2021-05-31 ENCOUNTER — Ambulatory Visit (INDEPENDENT_AMBULATORY_CARE_PROVIDER_SITE_OTHER): Payer: Medicare Other

## 2021-05-31 DIAGNOSIS — J309 Allergic rhinitis, unspecified: Secondary | ICD-10-CM

## 2021-06-07 ENCOUNTER — Ambulatory Visit (INDEPENDENT_AMBULATORY_CARE_PROVIDER_SITE_OTHER): Payer: Medicare Other

## 2021-06-07 DIAGNOSIS — J309 Allergic rhinitis, unspecified: Secondary | ICD-10-CM | POA: Diagnosis not present

## 2021-06-08 DIAGNOSIS — J302 Other seasonal allergic rhinitis: Secondary | ICD-10-CM | POA: Diagnosis not present

## 2021-06-08 NOTE — Progress Notes (Signed)
VIALS EXP 06-09-22 ?

## 2021-06-09 DIAGNOSIS — J3081 Allergic rhinitis due to animal (cat) (dog) hair and dander: Secondary | ICD-10-CM | POA: Diagnosis not present

## 2021-06-14 ENCOUNTER — Ambulatory Visit (INDEPENDENT_AMBULATORY_CARE_PROVIDER_SITE_OTHER): Payer: Medicare Other

## 2021-06-14 DIAGNOSIS — J309 Allergic rhinitis, unspecified: Secondary | ICD-10-CM | POA: Diagnosis not present

## 2021-06-21 ENCOUNTER — Ambulatory Visit (INDEPENDENT_AMBULATORY_CARE_PROVIDER_SITE_OTHER): Payer: Medicare Other | Admitting: *Deleted

## 2021-06-21 DIAGNOSIS — J309 Allergic rhinitis, unspecified: Secondary | ICD-10-CM

## 2021-06-28 ENCOUNTER — Ambulatory Visit (INDEPENDENT_AMBULATORY_CARE_PROVIDER_SITE_OTHER): Payer: Medicare Other

## 2021-06-28 DIAGNOSIS — J309 Allergic rhinitis, unspecified: Secondary | ICD-10-CM

## 2021-07-03 DIAGNOSIS — L609 Nail disorder, unspecified: Secondary | ICD-10-CM | POA: Diagnosis not present

## 2021-07-03 DIAGNOSIS — M79675 Pain in left toe(s): Secondary | ICD-10-CM | POA: Diagnosis not present

## 2021-07-03 DIAGNOSIS — M79671 Pain in right foot: Secondary | ICD-10-CM | POA: Diagnosis not present

## 2021-07-03 DIAGNOSIS — I739 Peripheral vascular disease, unspecified: Secondary | ICD-10-CM | POA: Diagnosis not present

## 2021-07-03 DIAGNOSIS — M79672 Pain in left foot: Secondary | ICD-10-CM | POA: Diagnosis not present

## 2021-07-03 DIAGNOSIS — M79674 Pain in right toe(s): Secondary | ICD-10-CM | POA: Diagnosis not present

## 2021-07-12 ENCOUNTER — Other Ambulatory Visit: Payer: Self-pay | Admitting: Allergy & Immunology

## 2021-07-12 ENCOUNTER — Ambulatory Visit (INDEPENDENT_AMBULATORY_CARE_PROVIDER_SITE_OTHER): Payer: Medicare Other

## 2021-07-12 DIAGNOSIS — J309 Allergic rhinitis, unspecified: Secondary | ICD-10-CM | POA: Diagnosis not present

## 2021-07-24 DIAGNOSIS — R946 Abnormal results of thyroid function studies: Secondary | ICD-10-CM | POA: Diagnosis not present

## 2021-07-24 DIAGNOSIS — R208 Other disturbances of skin sensation: Secondary | ICD-10-CM | POA: Diagnosis not present

## 2021-07-24 DIAGNOSIS — E7849 Other hyperlipidemia: Secondary | ICD-10-CM | POA: Diagnosis not present

## 2021-07-24 DIAGNOSIS — R7309 Other abnormal glucose: Secondary | ICD-10-CM | POA: Diagnosis not present

## 2021-07-24 DIAGNOSIS — G309 Alzheimer's disease, unspecified: Secondary | ICD-10-CM | POA: Diagnosis not present

## 2021-07-24 DIAGNOSIS — M1991 Primary osteoarthritis, unspecified site: Secondary | ICD-10-CM | POA: Diagnosis not present

## 2021-07-24 DIAGNOSIS — R8290 Unspecified abnormal findings in urine: Secondary | ICD-10-CM | POA: Diagnosis not present

## 2021-07-24 DIAGNOSIS — R42 Dizziness and giddiness: Secondary | ICD-10-CM | POA: Diagnosis not present

## 2021-07-24 DIAGNOSIS — L659 Nonscarring hair loss, unspecified: Secondary | ICD-10-CM | POA: Diagnosis not present

## 2021-07-24 DIAGNOSIS — J45909 Unspecified asthma, uncomplicated: Secondary | ICD-10-CM | POA: Diagnosis not present

## 2021-07-24 DIAGNOSIS — K649 Unspecified hemorrhoids: Secondary | ICD-10-CM | POA: Diagnosis not present

## 2021-07-24 DIAGNOSIS — E782 Mixed hyperlipidemia: Secondary | ICD-10-CM | POA: Diagnosis not present

## 2021-07-28 ENCOUNTER — Ambulatory Visit (INDEPENDENT_AMBULATORY_CARE_PROVIDER_SITE_OTHER): Payer: Medicare Other

## 2021-07-28 DIAGNOSIS — J309 Allergic rhinitis, unspecified: Secondary | ICD-10-CM | POA: Diagnosis not present

## 2021-08-02 ENCOUNTER — Ambulatory Visit (INDEPENDENT_AMBULATORY_CARE_PROVIDER_SITE_OTHER): Payer: Medicare Other

## 2021-08-02 DIAGNOSIS — J309 Allergic rhinitis, unspecified: Secondary | ICD-10-CM | POA: Diagnosis not present

## 2021-08-11 ENCOUNTER — Ambulatory Visit (INDEPENDENT_AMBULATORY_CARE_PROVIDER_SITE_OTHER): Payer: Medicare Other

## 2021-08-11 DIAGNOSIS — J309 Allergic rhinitis, unspecified: Secondary | ICD-10-CM | POA: Diagnosis not present

## 2021-08-23 ENCOUNTER — Encounter: Payer: Self-pay | Admitting: Allergy & Immunology

## 2021-08-23 ENCOUNTER — Other Ambulatory Visit: Payer: Self-pay

## 2021-08-23 ENCOUNTER — Ambulatory Visit: Payer: Medicare Other | Admitting: Allergy & Immunology

## 2021-08-23 VITALS — BP 126/78 | HR 65 | Temp 97.0°F | Ht 62.0 in | Wt 174.2 lb

## 2021-08-23 DIAGNOSIS — J3089 Other allergic rhinitis: Secondary | ICD-10-CM | POA: Diagnosis not present

## 2021-08-23 DIAGNOSIS — K219 Gastro-esophageal reflux disease without esophagitis: Secondary | ICD-10-CM | POA: Diagnosis not present

## 2021-08-23 DIAGNOSIS — R490 Dysphonia: Secondary | ICD-10-CM

## 2021-08-23 DIAGNOSIS — J309 Allergic rhinitis, unspecified: Secondary | ICD-10-CM

## 2021-08-23 DIAGNOSIS — J683 Other acute and subacute respiratory conditions due to chemicals, gases, fumes and vapors: Secondary | ICD-10-CM

## 2021-08-23 MED ORDER — SPIRIVA RESPIMAT 2.5 MCG/ACT IN AERS: 1.0000 | INHALATION_SPRAY | Freq: Every day | RESPIRATORY_TRACT | 2 refills | Status: DC

## 2021-08-23 MED ORDER — MONTELUKAST SODIUM 10 MG PO TABS: 10.0000 mg | ORAL_TABLET | Freq: Every day | ORAL | 1 refills | Status: DC

## 2021-08-23 MED ORDER — ALBUTEROL SULFATE HFA 108 (90 BASE) MCG/ACT IN AERS: INHALATION_SPRAY | RESPIRATORY_TRACT | 1 refills | Status: DC

## 2021-08-23 MED ORDER — FLUTICASONE PROPIONATE 50 MCG/ACT NA SUSP: NASAL | 5 refills | Status: DC

## 2021-08-23 MED ORDER — LEVOCETIRIZINE DIHYDROCHLORIDE 5 MG PO TABS: 5.0000 mg | ORAL_TABLET | Freq: Every day | ORAL | 1 refills | Status: DC | PRN

## 2021-08-23 NOTE — Patient Instructions (Addendum)
1. Chronic rhinitis (grasses, weeds, ragweed, trees, molds, cat, dog, cockroach, dust mite) ?- Continue with allergy shots at the same schedule.  ?- Continue with fluticasone 1-2 sprays per nostril daily + 1-2 sprays of azelastine nasal sprays per nostril daily as needed.  ?- Continue with Xyzal 5mg  1-2 times daily as needed.  ?- Continue with Singulair (montelukast) 10mg  daily. ? ?2. Cough - likely reactive airway dysfunction syndrome ?- Lung function not done today.  ?- Continue with Spiriva two puffs once daily.  ?- Continue with albuterol 2 puffs every 4 hours as needed.  ?- Consider Tezspire for better management of your breathing.  ?- I think waiting until you move out of the current home to see if this helps with your breathing.  ? ?3. Reflux  ?- Continue with Pepcid (famotidine) 20mg  twice daily as needed.  ?- Continue with omeprazole daily during particularly bad times.    ?- Continue with Tums as needed. ? ?4. Return in about 6 months (around 02/23/2022).   ? ? ?Please inform us of any Emergency Department visits, hospitalizations, or changes in symptoms. Call us before going to the ED for breathing or allergy symptoms since we might be able to fit you in for a sick visit. Feel free to contact us anytime with any questions, problems, or concerns. ? ?It was a pleasure to see you again today! ? ?Websites that have reliable patient information: ?1. American Academy of Asthma, Allergy, and Immunology: www.aaaai.org ?2. Food Allergy Research and Education (FARE): foodallergy.org ?3. Mothers of Asthmatics: http://www.asthmacommunitynetwork.org ?4. [employer] of Allergy, Asthma, and Immunology: MonthlyElectricBill.co.uk ? ? ?COVID-19 Vaccine Information can be found at: ShippingScam.co.uk For questions related to vaccine distribution or appointments, please email [email or web address] or call [phone].  ? ?We realize that you might be concerned about  having an allergic reaction to the COVID19 vaccines. To help with that concern, WE ARE OFFERING THE COVID19 VACCINES IN OUR OFFICE! Ask the front desk for dates!  ? ? ? ??Like? [place] on Facebook and Instagram for our latest updates!  ?  ? ? ?A healthy democracy works best when [employer] participate! Make sure you are registered to vote! If you have moved or changed any of your contact information, you will need to get this updated before voting! ? ?In some cases, you MAY be able to register to vote online: [email or web address] ? ? ? ? ?

## 2021-08-23 NOTE — Progress Notes (Signed)
FOLLOW UP  Date of Service/Encounter:  08/23/21   Assessment:   Moderate persistent reactive airways dysfunction syndrome   Seasonal and perennial allergic rhinitis (grasses, weeds, trees, most mold mixes, and cat)   Gastroesophageal reflux disease   Overall, [name] is doing very well.  She does not want continuing to change anything at this point.  She is in the middle of some renovations in her new home, so she knows that this is likely flaring a lot of breathing issues.  Once she is finished with all that and if she is still having some breathing difficulties, we will consider the addition of Tezspire.   Plan/Recommendations:   1. Chronic rhinitis (grasses, weeds, ragweed, trees, molds, cat, dog, cockroach, dust mite) - Continue with allergy shots at the same schedule.  - Continue with fluticasone 1-2 sprays per nostril daily + 1-2 sprays of azelastine nasal sprays per nostril daily as needed.  - Continue with Xyzal 5mg  1-2 times daily as needed.  - Continue with Singulair (montelukast) 10mg  daily.  2. Cough - likely reactive airway dysfunction syndrome - Lung function not done today.  - Continue with Spiriva two puffs once daily.  - Continue with albuterol 2 puffs every 4 hours as needed.  - Consider Tezspire for better management of your breathing.  - I think waiting until you move out of the current home to see if this helps with your breathing.   3. Reflux  - Continue with Pepcid (famotidine) 20mg  twice daily as needed.  - Continue with omeprazole daily during particularly bad times.    - Continue with Tums as needed.  4. Return in about 6 months (around 02/23/2022).      Subjective:   [name] is a 71 y.o. female presenting today for follow up of  Chief Complaint  Patient presents with   Gastroesophageal Reflux    Pt. Says that it is controllable    Allergic Rhinitis     Her allergies are better she still has a cough and itchy eyes.    Follow-up     The Singulair has helped a lot with the airway dysfunction REFILLl:xyzal, spiriva respimat    [name] has a history of the following: Patient Active Problem List   Diagnosis Date Noted   Seasonal and perennial allergic rhinitis 03/12/2017   Reactive airways dysfunction syndrome 03/12/2017   Gastroesophageal reflux disease 03/12/2017   Closed fracture of right tibial plateau 08/20/2016    History obtained from: chart review and patient.  [name] is a 71 y.o. female presenting for a follow up visit.  She was last seen in November 2022.  At that time, we continue with allergy shots at the same schedule.  We also continue with Flonase as well as Astelin and Xyzal.  We also continued symptoms Singulair.  For her cough, her lung function looked great.  We continue with Spiriva 2 puffs once daily and albuterol as needed.  For her reflux, we continue with Pepcid as well as omeprazole and Tums.  Since last visit, she has done well.  She and her husband are moving into a one level home. She is now in the split level. Her husband's uncle left the house to him and his cousin. They have gutted a couple of bathrooms to try to make things more accommodating for her husband.   Asthma/Respiratory Symptom History: Breathing has been so so with the Spiriva. She has decreased coughing spells since she started seeing me. She  thinks that she is allergic to her new house. There is mold issues in the current home and they have been working on getting this fixed. The yard floods so there is a mold  somewhere outdoors. They are installing a dehumidifier in the basement to get rid of that possibility. She knows that the Singulair helped a lot.   Allergic Rhinitis Symptom History: She did have some itching from the last injection. It turned very red and become hard.  However, normally she does fine with injections.  She still feels that injections have helped.  [name] is on allergen immunotherapy. She  receives two injections. Immunotherapy script #1 contains trees, weeds, grasses, cat and dog. She currently receives 0.17mL of the RED vial (1/100). Immunotherapy script #2 contains molds, dust mites and cockroach. She currently receives 0.84mL of the RED vial (1/100). She started shots December of 2018 and reached maintenance in August 2019. She has had no reactions to her allergy shots.   GERD Symptom History: She has coughing spells nearly all of the time after eating. She remains on her PPI and H2 blockers. This is just about after every meal that she coughs.  She is not interested in getting surgery done and therefore they are not interested in a GI referral.   Otherwise, there have been no changes to her past medical history, surgical history, family history, or social history.    Review of Systems  Constitutional: Negative.  Negative for chills, fever, malaise/fatigue and weight loss.  HENT: Negative.  Negative for congestion, ear discharge and ear pain.   Eyes:  Negative for pain, discharge and redness.  Respiratory:  Negative for cough, sputum production, shortness of breath and wheezing.   Cardiovascular: Negative.  Negative for chest pain and palpitations.  Gastrointestinal:  Negative for abdominal pain, diarrhea, heartburn, nausea and vomiting.  Skin: Negative.  Negative for itching and rash.  Neurological:  Negative for dizziness and headaches.  Endo/Heme/Allergies:  Negative for environmental allergies. Does not bruise/bleed easily.      Objective:   Blood pressure 126/78, pulse 65, temperature (!) 97 F (36.1 C), height 5\' 2"  (1.575 m), weight 174 lb 3 oz (79 kg), SpO2 96 %. Body mass index is 31.86 kg/m.    Physical Exam Vitals reviewed.  Constitutional:      Appearance: She is well-developed.     Comments: Smiling.  HENT:     Head: Normocephalic and atraumatic.     Right Ear: Tympanic membrane, ear canal and external ear normal.     Left Ear: Tympanic membrane,  ear canal and external ear normal.     Nose: No nasal deformity, septal deviation, mucosal edema or rhinorrhea.     Right Turbinates: Enlarged and swollen.     Left Turbinates: Enlarged and swollen.     Right Sinus: No maxillary sinus tenderness or frontal sinus tenderness.     Left Sinus: No maxillary sinus tenderness or frontal sinus tenderness.     Comments: No nasal polyps.    Mouth/Throat:     Mouth: Mucous membranes are not pale and not dry.     Pharynx: Uvula midline.  Eyes:     General:        Right eye: No discharge.        Left eye: No discharge.     Conjunctiva/sclera: Conjunctivae normal.     Right eye: Right conjunctiva is not injected. No chemosis.    Left eye: Left conjunctiva is not injected. No chemosis.  Pupils: Pupils are equal, round, and reactive to light.  Cardiovascular:     Rate and Rhythm: Normal rate and regular rhythm.     Heart sounds: Normal heart sounds.  Pulmonary:     Effort: Pulmonary effort is normal. No tachypnea, accessory muscle usage or respiratory distress.     Breath sounds: Normal breath sounds. No wheezing, rhonchi or rales.     Comments: Moving air well in all lung fields.  No increased work of breathing.  Occasional hoarseness, but nothing like it normally is. Chest:     Chest wall: No tenderness.  Lymphadenopathy:     Cervical: No cervical adenopathy.  Skin:    Coloration: Skin is not pale.     Findings: No abrasion, erythema, petechiae or rash. Rash is not papular, urticarial or vesicular.  Neurological:     Mental Status: She is alert.  Psychiatric:        Behavior: Behavior is cooperative.     Diagnostic studies: none       Malachi Bonds, MD  [facility]

## 2021-08-25 ENCOUNTER — Encounter: Payer: Self-pay | Admitting: Allergy & Immunology

## 2021-09-08 ENCOUNTER — Ambulatory Visit (INDEPENDENT_AMBULATORY_CARE_PROVIDER_SITE_OTHER): Payer: Medicare Other

## 2021-09-08 DIAGNOSIS — J309 Allergic rhinitis, unspecified: Secondary | ICD-10-CM | POA: Diagnosis not present

## 2021-09-22 ENCOUNTER — Ambulatory Visit (INDEPENDENT_AMBULATORY_CARE_PROVIDER_SITE_OTHER): Payer: Medicare Other

## 2021-09-22 DIAGNOSIS — J309 Allergic rhinitis, unspecified: Secondary | ICD-10-CM

## 2021-09-25 DIAGNOSIS — J301 Allergic rhinitis due to pollen: Secondary | ICD-10-CM | POA: Diagnosis not present

## 2021-09-25 NOTE — Progress Notes (Signed)
EXP 09/26/22 

## 2021-09-26 DIAGNOSIS — J3081 Allergic rhinitis due to animal (cat) (dog) hair and dander: Secondary | ICD-10-CM | POA: Diagnosis not present

## 2021-10-13 ENCOUNTER — Ambulatory Visit (INDEPENDENT_AMBULATORY_CARE_PROVIDER_SITE_OTHER): Payer: Medicare Other

## 2021-10-13 DIAGNOSIS — J309 Allergic rhinitis, unspecified: Secondary | ICD-10-CM | POA: Diagnosis not present

## 2021-10-23 DIAGNOSIS — Z1231 Encounter for screening mammogram for malignant neoplasm of breast: Secondary | ICD-10-CM | POA: Diagnosis not present

## 2021-11-01 ENCOUNTER — Ambulatory Visit (INDEPENDENT_AMBULATORY_CARE_PROVIDER_SITE_OTHER): Payer: Medicare Other

## 2021-11-01 DIAGNOSIS — J309 Allergic rhinitis, unspecified: Secondary | ICD-10-CM | POA: Diagnosis not present

## 2021-11-07 ENCOUNTER — Other Ambulatory Visit (HOSPITAL_COMMUNITY): Payer: Self-pay | Admitting: Family Medicine

## 2021-11-07 DIAGNOSIS — Z1231 Encounter for screening mammogram for malignant neoplasm of breast: Secondary | ICD-10-CM

## 2021-11-07 DIAGNOSIS — Z780 Asymptomatic menopausal state: Secondary | ICD-10-CM | POA: Diagnosis not present

## 2021-11-07 DIAGNOSIS — M8589 Other specified disorders of bone density and structure, multiple sites: Secondary | ICD-10-CM | POA: Diagnosis not present

## 2021-11-07 DIAGNOSIS — E2839 Other primary ovarian failure: Secondary | ICD-10-CM

## 2021-11-13 DIAGNOSIS — L609 Nail disorder, unspecified: Secondary | ICD-10-CM | POA: Diagnosis not present

## 2021-11-13 DIAGNOSIS — M79672 Pain in left foot: Secondary | ICD-10-CM | POA: Diagnosis not present

## 2021-11-13 DIAGNOSIS — L600 Ingrowing nail: Secondary | ICD-10-CM | POA: Diagnosis not present

## 2021-11-13 DIAGNOSIS — M79675 Pain in left toe(s): Secondary | ICD-10-CM | POA: Diagnosis not present

## 2021-11-15 ENCOUNTER — Ambulatory Visit (INDEPENDENT_AMBULATORY_CARE_PROVIDER_SITE_OTHER): Payer: Medicare Other

## 2021-11-15 DIAGNOSIS — J309 Allergic rhinitis, unspecified: Secondary | ICD-10-CM

## 2021-11-27 DIAGNOSIS — M67471 Ganglion, right ankle and foot: Secondary | ICD-10-CM | POA: Diagnosis not present

## 2021-11-27 DIAGNOSIS — M79671 Pain in right foot: Secondary | ICD-10-CM | POA: Diagnosis not present

## 2021-12-06 ENCOUNTER — Ambulatory Visit (INDEPENDENT_AMBULATORY_CARE_PROVIDER_SITE_OTHER): Payer: Medicare Other

## 2021-12-06 DIAGNOSIS — J309 Allergic rhinitis, unspecified: Secondary | ICD-10-CM | POA: Diagnosis not present

## 2021-12-13 ENCOUNTER — Ambulatory Visit (INDEPENDENT_AMBULATORY_CARE_PROVIDER_SITE_OTHER): Payer: Medicare Other

## 2021-12-13 DIAGNOSIS — J309 Allergic rhinitis, unspecified: Secondary | ICD-10-CM

## 2021-12-22 ENCOUNTER — Ambulatory Visit (INDEPENDENT_AMBULATORY_CARE_PROVIDER_SITE_OTHER): Payer: Medicare Other

## 2021-12-22 DIAGNOSIS — J309 Allergic rhinitis, unspecified: Secondary | ICD-10-CM

## 2022-01-03 ENCOUNTER — Ambulatory Visit (INDEPENDENT_AMBULATORY_CARE_PROVIDER_SITE_OTHER): Payer: Medicare Other

## 2022-01-03 DIAGNOSIS — J309 Allergic rhinitis, unspecified: Secondary | ICD-10-CM | POA: Diagnosis not present

## 2022-01-12 ENCOUNTER — Other Ambulatory Visit: Payer: Self-pay | Admitting: Allergy & Immunology

## 2022-01-30 DIAGNOSIS — R946 Abnormal results of thyroid function studies: Secondary | ICD-10-CM | POA: Diagnosis not present

## 2022-01-30 DIAGNOSIS — Z0001 Encounter for general adult medical examination with abnormal findings: Secondary | ICD-10-CM | POA: Diagnosis not present

## 2022-01-30 DIAGNOSIS — R7309 Other abnormal glucose: Secondary | ICD-10-CM | POA: Diagnosis not present

## 2022-01-30 DIAGNOSIS — J302 Other seasonal allergic rhinitis: Secondary | ICD-10-CM | POA: Diagnosis not present

## 2022-01-30 DIAGNOSIS — B3784 Candidal otitis externa: Secondary | ICD-10-CM | POA: Diagnosis not present

## 2022-01-30 DIAGNOSIS — J45909 Unspecified asthma, uncomplicated: Secondary | ICD-10-CM | POA: Diagnosis not present

## 2022-01-30 DIAGNOSIS — E782 Mixed hyperlipidemia: Secondary | ICD-10-CM | POA: Diagnosis not present

## 2022-01-31 ENCOUNTER — Ambulatory Visit (INDEPENDENT_AMBULATORY_CARE_PROVIDER_SITE_OTHER): Payer: Medicare Other

## 2022-01-31 DIAGNOSIS — J309 Allergic rhinitis, unspecified: Secondary | ICD-10-CM | POA: Diagnosis not present

## 2022-02-09 ENCOUNTER — Ambulatory Visit (INDEPENDENT_AMBULATORY_CARE_PROVIDER_SITE_OTHER): Payer: Medicare Other

## 2022-02-09 DIAGNOSIS — J309 Allergic rhinitis, unspecified: Secondary | ICD-10-CM | POA: Diagnosis not present

## 2022-02-14 ENCOUNTER — Ambulatory Visit (INDEPENDENT_AMBULATORY_CARE_PROVIDER_SITE_OTHER): Payer: Medicare Other

## 2022-02-14 DIAGNOSIS — J309 Allergic rhinitis, unspecified: Secondary | ICD-10-CM

## 2022-02-28 ENCOUNTER — Ambulatory Visit (INDEPENDENT_AMBULATORY_CARE_PROVIDER_SITE_OTHER): Payer: Medicare Other

## 2022-02-28 ENCOUNTER — Ambulatory Visit: Payer: Medicare Other | Admitting: Allergy & Immunology

## 2022-02-28 DIAGNOSIS — J309 Allergic rhinitis, unspecified: Secondary | ICD-10-CM | POA: Diagnosis not present

## 2022-03-05 ENCOUNTER — Telehealth: Payer: Self-pay

## 2022-03-05 ENCOUNTER — Encounter: Payer: Self-pay | Admitting: Allergy & Immunology

## 2022-03-05 ENCOUNTER — Ambulatory Visit: Payer: Medicare Other | Admitting: Allergy & Immunology

## 2022-03-05 ENCOUNTER — Other Ambulatory Visit: Payer: Self-pay

## 2022-03-05 VITALS — BP 122/84 | HR 65 | Temp 97.7°F | Resp 16 | Ht 64.0 in | Wt 168.0 lb

## 2022-03-05 DIAGNOSIS — J302 Other seasonal allergic rhinitis: Secondary | ICD-10-CM

## 2022-03-05 DIAGNOSIS — J309 Allergic rhinitis, unspecified: Secondary | ICD-10-CM | POA: Diagnosis not present

## 2022-03-05 DIAGNOSIS — J683 Other acute and subacute respiratory conditions due to chemicals, gases, fumes and vapors: Secondary | ICD-10-CM

## 2022-03-05 MED ORDER — EPINEPHRINE 0.3 MG/0.3ML IJ SOAJ: 0.3000 mg | Freq: Once | INTRAMUSCULAR | 1 refills | Status: DC | PRN

## 2022-03-05 MED ORDER — AZELASTINE HCL 137 MCG/SPRAY NA SOLN: 1.0000 | Freq: Two times a day (BID) | NASAL | 5 refills | Status: DC

## 2022-03-05 MED ORDER — SPIRIVA RESPIMAT 2.5 MCG/ACT IN AERS: 1.0000 | INHALATION_SPRAY | Freq: Every day | RESPIRATORY_TRACT | 5 refills | Status: DC

## 2022-03-05 MED ORDER — OMEPRAZOLE 20 MG PO CPDR: 20.0000 mg | DELAYED_RELEASE_CAPSULE | Freq: Every day | ORAL | 5 refills | Status: DC

## 2022-03-05 MED ORDER — ALBUTEROL SULFATE HFA 108 (90 BASE) MCG/ACT IN AERS: INHALATION_SPRAY | RESPIRATORY_TRACT | 1 refills | Status: DC

## 2022-03-05 MED ORDER — FLUTICASONE PROPIONATE 50 MCG/ACT NA SUSP: 1.0000 | Freq: Two times a day (BID) | NASAL | 5 refills | Status: DC

## 2022-03-05 MED ORDER — MONTELUKAST SODIUM 10 MG PO TABS: 10.0000 mg | ORAL_TABLET | Freq: Every day | ORAL | 1 refills | Status: DC

## 2022-03-05 MED ORDER — LEVOCETIRIZINE DIHYDROCHLORIDE 5 MG PO TABS: 5.0000 mg | ORAL_TABLET | Freq: Two times a day (BID) | ORAL | 1 refills | Status: DC | PRN

## 2022-03-05 NOTE — Patient Instructions (Addendum)
1. Chronic rhinitis (grasses, weeds, ragweed, trees, molds, cat, dog, cockroach, dust mite) - Continue with allergy shots at the same schedule.  - Continue with fluticasone 1-2 sprays per nostril daily + 1-2 sprays of azelastine nasal sprays per nostril daily as needed.  - You can do one spray of each twice daily without a problem.  - Continue with Xyzal 5mg  1-2 times daily as needed.  - Continue with Singulair (montelukast) 10mg  daily.  2. Cough - likely reactive airway dysfunction syndrome - Lung function looked awesome today.  - Continue with Spiriva two puffs once daily.  - Continue with albuterol 2 puffs every 4 hours as needed.  - Consider Tezspire for better management of your breathing.  - I think waiting until you move out of the current home to see if this helps with your breathing.   3. Reflux  - Continue with Pepcid (famotidine) 20mg  twice daily as needed.  - Continue with omeprazole daily during particularly bad times.    - Continue with Tums as needed.  4. Return in about 6 months (around 09/03/2022).     Please inform of any Emergency Department visits, hospitalizations, or changes in symptoms. Call before going to the ED for breathing or allergy symptoms since we might be able to fit you in for a sick visit. Feel free to contact 09/05/2022 anytime with any questions, problems, or concerns.  It was a pleasure to see you again today! [employer] THE [employer] MOVE!   Websites that have reliable patient information: 1. American Academy of Asthma, Allergy, and Immunology: www.aaaai.org 2. Food Allergy Research and Education (FARE): foodallergy.org 3. Mothers of Asthmatics: http://www.asthmacommunitynetwork.org 4. American College of Allergy, Asthma, and Immunology: www.acaai.org   COVID-19 Vaccine Information can be found at: [place] For questions related to vaccine distribution or appointments, please email  [email or web address] or call [phone].   We realize that you might be concerned about having an allergic reaction to the COVID19 vaccines. To help with that concern, WE ARE OFFERING THE COVID19 VACCINES IN OUR OFFICE! Ask the front desk for dates!     "Like" [place] on Facebook and Instagram for our latest updates!      A healthy democracy works best when [email or web address] participate! Make sure you are registered to vote! If you have moved or changed any of your contact information, you will need to get this updated before voting!  In some cases, you MAY be able to register to vote online: [phone]

## 2022-03-05 NOTE — Progress Notes (Signed)
FOLLOW UP  Date of Service/Encounter:  03/05/22   Assessment:   Moderate persistent reactive airways dysfunction syndrome   Seasonal and perennial allergic rhinitis (grasses, weeds, trees, most mold mixes, and cat) - on allergen immunotherapy with maintenance reached August 2019   Gastroesophageal reflux disease  Plan/Recommendations:   1. Chronic rhinitis (grasses, weeds, ragweed, trees, molds, cat, dog, cockroach, dust mite) - Continue with allergy shots at the same schedule.  - Continue with fluticasone 1-2 sprays per nostril daily + 1-2 sprays of azelastine nasal sprays per nostril daily as needed.  - You can do one spray of each twice daily without a problem.  - Continue with Xyzal 5mg  1-2 times daily as needed.  - Continue with Singulair (montelukast) 10mg  daily.  2. Cough - likely reactive airway dysfunction syndrome - Lung function looked awesome today.  - Continue with Spiriva two puffs once daily.  - Continue with albuterol 2 puffs every 4 hours as needed.  - Consider Tezspire for better management of your breathing.  - I think waiting until you move out of the current home to see if this helps with your breathing.   3. Reflux  - Continue with Pepcid (famotidine) 20mg  twice daily as needed.  - Continue with omeprazole daily during particularly bad times.    - Continue with Tums as needed.  4. Return in about 6 months (around 09/03/2022).     Subjective:   [name] is a 71 y.o. female presenting today for follow up of  Chief Complaint  Patient presents with   Gastroesophageal Reflux    Better    Cough    Has been having a lot of cough going on.Sometimes with phlegm and dry.    Medication Refill    Omeprazole, flovent, spiriva    [name] has a history of the following: Patient Active Problem List   Diagnosis Date Noted   Seasonal and perennial allergic rhinitis 03/12/2017   Reactive airways dysfunction syndrome 03/12/2017    Gastroesophageal reflux disease 03/12/2017   Closed fracture of right tibial plateau 08/20/2016    History obtained from: chart review and patient.  [name] is a 71 y.o. female presenting for a follow up visit. She was last seen in May 2023. At that time, she was doing well with the allergy shots as well as the nose sprays. She also remained on the Xyzal and Singulair. For her coughing, we did not do lung testing at all. We also continued with Spiriva as well as albuterol. We did discuss starting Tezspire to help with this coughing/irritable airways. For her reflux, we continued with the use of Pepcid as well as omeprazole and Tums.   Since the last visit, she has largely done well. She ended up moving finally around September 2023. She still has a lot unpacking to do. They did put a dehumidifier down in the basement. She thinks that this is going to help. They have not sold their other house yet.   Asthma/Respiratory Symptom History: Her cough is worse now. But fall is the worst time of the year for her symptoms overall. She has not been raking leaves since that always seems to make it worse. She reports that it got better with the dehumidifier.  They have COVID19 recently and she did not treat it since her symptoms seemed to be fairly low key.   Allergic Rhinitis Symptom History: She does not think that  her symptoms are too terrible. She is requesting to  use the nose sprays twice daily instead of once daily. She thinks that this change will help her to be able to tolerate her symptoms more efficiently.   [name] is on allergen immunotherapy. She receives two injections. Immunotherapy script #1 contains trees, weeds, grasses, cat and dog. She currently receives 0.55mL of the RED vial (1/100). Immunotherapy script #2 contains molds, dust mites and cockroach. She currently receives 0.16mL of the RED vial (1/100). She started shots December of 2018 and reached maintenance in August 2019. She has had no  reactions to her allergy shots.    Otherwise, there have been no changes to her past medical history, surgical history, family history, or social history.    Review of Systems  Constitutional: Negative.  Negative for chills, fever, malaise/fatigue and weight loss.  HENT: Negative.  Negative for congestion, ear discharge and ear pain.        Positive for postnasal drip.   Eyes:  Negative for pain, discharge and redness.  Respiratory:  Positive for cough. Negative for sputum production, shortness of breath and wheezing.   Cardiovascular: Negative.  Negative for chest pain and palpitations.  Gastrointestinal:  Negative for abdominal pain, diarrhea, heartburn, nausea and vomiting.  Skin: Negative.  Negative for itching and rash.  Neurological:  Negative for dizziness and headaches.  Endo/Heme/Allergies:  Negative for environmental allergies. Does not bruise/bleed easily.       Objective:   Blood pressure 122/84, pulse 65, temperature 97.7 F (36.5 C), resp. rate 16, height 5\' 4"  (1.626 m), weight 168 lb (76.2 kg), SpO2 95 %. Body mass index is 28.84 kg/m.    Physical Exam Vitals reviewed.  Constitutional:      Appearance: She is well-developed.     Comments: Smiling.  HENT:     Head: Normocephalic and atraumatic.     Right Ear: Tympanic membrane, ear canal and external ear normal.     Left Ear: Tympanic membrane, ear canal and external ear normal.     Nose: No nasal deformity, septal deviation, mucosal edema or rhinorrhea.     Right Turbinates: Enlarged and swollen.     Left Turbinates: Enlarged and swollen.     Right Sinus: No maxillary sinus tenderness or frontal sinus tenderness.     Left Sinus: No maxillary sinus tenderness or frontal sinus tenderness.     Comments: No nasal polyps.    Mouth/Throat:     Mouth: Mucous membranes are not pale and not dry.     Pharynx: Uvula midline.  Eyes:     General:        Right eye: No discharge.        Left eye: No discharge.      Conjunctiva/sclera: Conjunctivae normal.     Right eye: Right conjunctiva is not injected. No chemosis.    Left eye: Left conjunctiva is not injected. No chemosis.    Pupils: Pupils are equal, round, and reactive to light.  Cardiovascular:     Rate and Rhythm: Normal rate and regular rhythm.     Heart sounds: Normal heart sounds.  Pulmonary:     Effort: Pulmonary effort is normal. No tachypnea, accessory muscle usage or respiratory distress.     Breath sounds: Normal breath sounds. No wheezing, rhonchi or rales.     Comments: Moving air well in all lung fields.  No increased work of breathing.  Occasional hoarseness, but nothing like it normally is. Chest:     Chest wall: No tenderness.  Lymphadenopathy:  Cervical: No cervical adenopathy.  Skin:    Coloration: Skin is not pale.     Findings: No abrasion, erythema, petechiae or rash. Rash is not papular, urticarial or vesicular.  Neurological:     Mental Status: She is alert.  Psychiatric:        Behavior: Behavior is cooperative.      Diagnostic studies:    Spirometry: results normal (FEV1: 2.02/91%, FVC: 2.55/89%, FEV1/FVC: 79%).    Spirometry consistent with normal pattern. Xopenex four puffs via MDI treatment given in clinic with no improvement.  Allergy Studies:  none       Malachi Bonds, MD  [facility]

## 2022-03-05 NOTE — Telephone Encounter (Signed)
PA request received via CMM through [employer] for Levocetirizine.   PA has been submitted and is awaiting determination.   Key: [id number]

## 2022-03-06 NOTE — Telephone Encounter (Signed)
PA has been DENIED for Levocetirizine 5mg  twice daily.

## 2022-03-08 NOTE — Telephone Encounter (Signed)
One tablet once daily is covered under the patients current plan.

## 2022-03-08 NOTE — Telephone Encounter (Signed)
Can you do a test claim for daily instead of twice a day and see if that will go thru? Thank you

## 2022-03-08 NOTE — Telephone Encounter (Signed)
Pt informed and already purchases some otc

## 2022-03-08 NOTE — Telephone Encounter (Signed)
Pts insurance will not cover 1 tablet bid but will cover 1 tablet daily for antihistamine please advise to change in therapy

## 2022-03-08 NOTE — Telephone Encounter (Signed)
I guess we can just do one tablet daily and she can purchase the rest OTC. #ThankYouInsurance #InsuranceCompaniesSuck  Malachi Bonds, MD [facility]

## 2022-03-16 ENCOUNTER — Ambulatory Visit (INDEPENDENT_AMBULATORY_CARE_PROVIDER_SITE_OTHER): Payer: Medicare Other

## 2022-03-16 DIAGNOSIS — J309 Allergic rhinitis, unspecified: Secondary | ICD-10-CM | POA: Diagnosis not present

## 2022-03-28 ENCOUNTER — Ambulatory Visit (INDEPENDENT_AMBULATORY_CARE_PROVIDER_SITE_OTHER): Payer: Medicare Other

## 2022-03-28 DIAGNOSIS — J309 Allergic rhinitis, unspecified: Secondary | ICD-10-CM | POA: Diagnosis not present

## 2022-04-10 NOTE — Progress Notes (Signed)
VIALS EXP 04-11-23 

## 2022-04-11 DIAGNOSIS — J301 Allergic rhinitis due to pollen: Secondary | ICD-10-CM | POA: Diagnosis not present

## 2022-04-12 DIAGNOSIS — J3081 Allergic rhinitis due to animal (cat) (dog) hair and dander: Secondary | ICD-10-CM | POA: Diagnosis not present

## 2022-04-16 DIAGNOSIS — R002 Palpitations: Secondary | ICD-10-CM | POA: Diagnosis not present

## 2022-04-20 ENCOUNTER — Ambulatory Visit (INDEPENDENT_AMBULATORY_CARE_PROVIDER_SITE_OTHER): Payer: Medicare Other

## 2022-04-20 DIAGNOSIS — J309 Allergic rhinitis, unspecified: Secondary | ICD-10-CM

## 2022-05-09 ENCOUNTER — Ambulatory Visit (INDEPENDENT_AMBULATORY_CARE_PROVIDER_SITE_OTHER): Payer: Medicare Other

## 2022-05-09 DIAGNOSIS — J309 Allergic rhinitis, unspecified: Secondary | ICD-10-CM

## 2022-05-23 ENCOUNTER — Ambulatory Visit (INDEPENDENT_AMBULATORY_CARE_PROVIDER_SITE_OTHER): Payer: Medicare Other

## 2022-05-23 DIAGNOSIS — J309 Allergic rhinitis, unspecified: Secondary | ICD-10-CM | POA: Diagnosis not present

## 2022-06-05 ENCOUNTER — Ambulatory Visit: Payer: Medicare Other | Admitting: Podiatry

## 2022-06-05 ENCOUNTER — Encounter: Payer: Self-pay | Admitting: Podiatry

## 2022-06-05 DIAGNOSIS — M7741 Metatarsalgia, right foot: Secondary | ICD-10-CM

## 2022-06-05 DIAGNOSIS — L603 Nail dystrophy: Secondary | ICD-10-CM

## 2022-06-05 DIAGNOSIS — L601 Onycholysis: Secondary | ICD-10-CM | POA: Diagnosis not present

## 2022-06-05 DIAGNOSIS — B351 Tinea unguium: Secondary | ICD-10-CM | POA: Diagnosis not present

## 2022-06-05 MED ORDER — NEOMYCIN-POLYMYXIN-HC 1 % OT SOLN: OTIC | 0 refills | Status: DC

## 2022-06-05 NOTE — Patient Instructions (Signed)

## 2022-06-06 ENCOUNTER — Ambulatory Visit (INDEPENDENT_AMBULATORY_CARE_PROVIDER_SITE_OTHER): Payer: Medicare Other

## 2022-06-06 DIAGNOSIS — J309 Allergic rhinitis, unspecified: Secondary | ICD-10-CM | POA: Diagnosis not present

## 2022-06-06 DIAGNOSIS — H43813 Vitreous degeneration, bilateral: Secondary | ICD-10-CM | POA: Diagnosis not present

## 2022-06-06 DIAGNOSIS — H25813 Combined forms of age-related cataract, bilateral: Secondary | ICD-10-CM | POA: Diagnosis not present

## 2022-06-06 DIAGNOSIS — H524 Presbyopia: Secondary | ICD-10-CM | POA: Diagnosis not present

## 2022-06-06 NOTE — Progress Notes (Signed)
  Subjective:  Patient ID: [name], female    DOB: 1950/11/14,  MRN: [id number]  Chief Complaint  Patient presents with   Nail Problem    np - rfc - nails grow under nails   Difficulty Walking    Right foot feels like she's walking on a ball /     72 y.o. female presents with the above complaint. History confirmed with patient.  Her nails are thick and cause discomfort, both great toenails can cause quite a bit of discomfort.  She has had previous bunionectomy on the right foot and what sounds like a Weil osteotomy to offload the second MTPJ, it is better than it used to be prior to surgery but she still has occasional tenderness  Objective:  Physical Exam: warm, good capillary refill, no trophic changes or ulcerative lesions, normal DP and PT pulses, and normal sensory exam. Left Foot:  Dystrophic nails, some mycosis, severe dystrophy of the hallux nail Right Foot:  Dystrophic nails, some mycosis, severe dystrophy of the hallux nail, well-healed surgical scars medial first MPJ and dorsal second PJ, she has mild tenderness in the plantar second MPJ but no evidence of neuroma or callus formation    Assessment:   1. Nail dystrophy   2. Metatarsalgia of right foot      Plan:  Patient was evaluated and treated and all questions answered.  Culture of the nail plate was taken today to assess for possibility of mycosis and we will discuss treatment options with the have these results back.  We discussed further treatment options for the severe dystrophy of bilateral hallux nail.  We discussed permanent removal of the nails.  She would like to proceed with this today.  Following consent and digital block with lidocaine and Marcaine, and prepped with Betadine the bilateral hallux nail plate was avulsed, and 3 applications of phenolic acid were applied to both nail beds.  It was irrigated with alcohol and dressed with Silvadene and a bandage.  Post care instructions given.  Cortisporin  sent to pharmacy for this.  Regarding her metatarsalgia, we discussed offloading with padding and possibility of custom molded orthoses.  Metatarsal pads were dispensed today.  She will let me know if this is helpful and if she is interested in orthotic support.  Do not see indication for consideration of surgery at this point  Return if symptoms worsen or fail to improve.

## 2022-06-20 ENCOUNTER — Ambulatory Visit (INDEPENDENT_AMBULATORY_CARE_PROVIDER_SITE_OTHER): Payer: Medicare Other

## 2022-06-20 DIAGNOSIS — J309 Allergic rhinitis, unspecified: Secondary | ICD-10-CM | POA: Diagnosis not present

## 2022-06-26 ENCOUNTER — Ambulatory Visit: Payer: Medicare Other | Admitting: Podiatry

## 2022-06-26 DIAGNOSIS — L603 Nail dystrophy: Secondary | ICD-10-CM | POA: Diagnosis not present

## 2022-06-27 ENCOUNTER — Ambulatory Visit (INDEPENDENT_AMBULATORY_CARE_PROVIDER_SITE_OTHER): Payer: Medicare Other

## 2022-06-27 DIAGNOSIS — J309 Allergic rhinitis, unspecified: Secondary | ICD-10-CM

## 2022-07-01 ENCOUNTER — Encounter: Payer: Self-pay | Admitting: Podiatry

## 2022-07-01 NOTE — Progress Notes (Signed)
  Subjective:  Patient ID: [name], female    DOB: 07/13/1950,  MRN: [id number]  Chief Complaint  Patient presents with   Ingrown Toenail    Bilateral hallux total nail removal, nail check, toes are red and sore,     72 y.o. female presents with the above complaint. History confirmed with patient.  She returns for follow-up having persistent scab redness and tenderness  Objective:  Physical Exam: warm, good capillary refill, no trophic changes or ulcerative lesions, normal DP and PT pulses, and normal sensory exam.  Bilateral total matricectomy sites are still healing, no evidence of infection there is some erythema   Assessment:   1. Nail dystrophy      Plan:  Patient was evaluated and treated and all questions answered.   Matricectomy sites appear to be healing well most of the erythema and tenderness is secondary to the phenolic acid and is inflammatory, do not see indication for further antibiotics at this point.  She may gradually begin leaving open to air further and further.  May continue soaks gradually and brush away some of the scab and this will clear out more of the drainage.  I will see her back in 1 month for a follow-up visit to ensure it continues to heal.  Return in about 4 weeks (around 07/24/2022) for nail re-check.

## 2022-07-04 ENCOUNTER — Ambulatory Visit (INDEPENDENT_AMBULATORY_CARE_PROVIDER_SITE_OTHER): Payer: Medicare Other

## 2022-07-04 DIAGNOSIS — J309 Allergic rhinitis, unspecified: Secondary | ICD-10-CM

## 2022-07-17 ENCOUNTER — Ambulatory Visit: Payer: Medicare Other | Admitting: Podiatry

## 2022-07-17 DIAGNOSIS — L603 Nail dystrophy: Secondary | ICD-10-CM | POA: Diagnosis not present

## 2022-07-17 DIAGNOSIS — M7741 Metatarsalgia, right foot: Secondary | ICD-10-CM

## 2022-07-18 ENCOUNTER — Ambulatory Visit (INDEPENDENT_AMBULATORY_CARE_PROVIDER_SITE_OTHER): Payer: Medicare Other

## 2022-07-18 DIAGNOSIS — J309 Allergic rhinitis, unspecified: Secondary | ICD-10-CM | POA: Diagnosis not present

## 2022-07-20 ENCOUNTER — Ambulatory Visit (INDEPENDENT_AMBULATORY_CARE_PROVIDER_SITE_OTHER): Payer: Medicare Other

## 2022-07-20 DIAGNOSIS — M7741 Metatarsalgia, right foot: Secondary | ICD-10-CM

## 2022-07-20 NOTE — Progress Notes (Signed)
Patient presents today to be casted for custom molded orthotics. MCDONALD is the treating physician.  Impression foam cast was taken. ABN signed.  Patient info-  Shoe size: 7  Shoe style: ATHLETIC  Height: 5FT 3IN  Weight: 170  Insurance: [employer]   Patient will be notified once orthotics arrive in office and reappoint for fitting at that time.

## 2022-07-22 ENCOUNTER — Encounter: Payer: Self-pay | Admitting: Podiatry

## 2022-07-22 NOTE — Progress Notes (Signed)
  Subjective:  Patient ID: [name], female    DOB: July 27, 1950,  MRN: [id number]  Chief Complaint  Patient presents with   Nail Problem    4 week nail check    72 y.o. female presents with the above complaint. History confirmed with patient.  She returns for follow-up doing better than last visit, still having some bleeding occasionally scabs are persisting  Objective:  Physical Exam: warm, good capillary refill, no trophic changes or ulcerative lesions, normal DP and PT pulses, and normal sensory exam.  Bilateral total matricectomy sites continue to heal, scab is still present but smaller in area   Assessment:   1. Nail dystrophy   2. Metatarsalgia of right foot      Plan:  Patient was evaluated and treated and all questions answered.   Continues to heal well.  I would recommend leaving open to air is much as possible at this point and can discontinue soaks and ointment bandaging.  She will get her orthoses on Thursday of this week and will let me know if there are issues.  Return if symptoms worsen or fail to improve.

## 2022-07-25 ENCOUNTER — Ambulatory Visit (INDEPENDENT_AMBULATORY_CARE_PROVIDER_SITE_OTHER): Payer: Medicare Other

## 2022-07-25 DIAGNOSIS — J309 Allergic rhinitis, unspecified: Secondary | ICD-10-CM

## 2022-08-01 ENCOUNTER — Ambulatory Visit (INDEPENDENT_AMBULATORY_CARE_PROVIDER_SITE_OTHER): Payer: Medicare Other

## 2022-08-01 DIAGNOSIS — J309 Allergic rhinitis, unspecified: Secondary | ICD-10-CM

## 2022-08-22 ENCOUNTER — Ambulatory Visit: Payer: Medicare Other

## 2022-08-22 ENCOUNTER — Ambulatory Visit (INDEPENDENT_AMBULATORY_CARE_PROVIDER_SITE_OTHER): Payer: Medicare Other

## 2022-08-22 DIAGNOSIS — M7741 Metatarsalgia, right foot: Secondary | ICD-10-CM | POA: Diagnosis not present

## 2022-08-22 DIAGNOSIS — J309 Allergic rhinitis, unspecified: Secondary | ICD-10-CM | POA: Diagnosis not present

## 2022-08-22 DIAGNOSIS — M79672 Pain in left foot: Secondary | ICD-10-CM | POA: Diagnosis not present

## 2022-08-22 NOTE — Progress Notes (Unsigned)
Patient presents today to pick up custom molded foot orthotics recommended by Dr. MCDONALD.   Orthotics were dispensed and fit was satisfactory. Reviewed instructions for break-in and wear. Written instructions given to patient.  Patient will follow up as needed.   

## 2022-08-27 ENCOUNTER — Ambulatory Visit: Payer: Medicare Other | Admitting: Internal Medicine

## 2022-08-27 VITALS — BP 130/78 | HR 66 | Temp 98.1°F | Resp 14 | Ht 63.25 in | Wt 176.0 lb

## 2022-08-27 DIAGNOSIS — K219 Gastro-esophageal reflux disease without esophagitis: Secondary | ICD-10-CM | POA: Diagnosis not present

## 2022-08-27 DIAGNOSIS — J3089 Other allergic rhinitis: Secondary | ICD-10-CM | POA: Diagnosis not present

## 2022-08-27 DIAGNOSIS — J302 Other seasonal allergic rhinitis: Secondary | ICD-10-CM

## 2022-08-27 DIAGNOSIS — R053 Chronic cough: Secondary | ICD-10-CM | POA: Diagnosis not present

## 2022-08-27 MED ORDER — FAMOTIDINE 20 MG PO TABS: 20.0000 mg | ORAL_TABLET | Freq: Two times a day (BID) | ORAL | 5 refills | Status: DC | PRN

## 2022-08-27 MED ORDER — OMEPRAZOLE 20 MG PO CPDR: 20.0000 mg | DELAYED_RELEASE_CAPSULE | Freq: Every day | ORAL | 5 refills | Status: DC | PRN

## 2022-08-27 MED ORDER — FLUTICASONE PROPIONATE 50 MCG/ACT NA SUSP: 2.0000 | Freq: Every day | NASAL | 5 refills | Status: DC

## 2022-08-27 MED ORDER — AZELASTINE HCL 137 MCG/SPRAY NA SOLN: 1.0000 | Freq: Two times a day (BID) | NASAL | 5 refills | Status: DC

## 2022-08-27 MED ORDER — MONTELUKAST SODIUM 10 MG PO TABS: 10.0000 mg | ORAL_TABLET | Freq: Every day | ORAL | 1 refills | Status: DC

## 2022-08-27 MED ORDER — ALBUTEROL SULFATE HFA 108 (90 BASE) MCG/ACT IN AERS: INHALATION_SPRAY | RESPIRATORY_TRACT | 1 refills | Status: DC

## 2022-08-27 MED ORDER — EPINEPHRINE 0.3 MG/0.3ML IJ SOAJ: 0.3000 mg | Freq: Once | INTRAMUSCULAR | 1 refills | Status: DC | PRN

## 2022-08-27 MED ORDER — LEVOCETIRIZINE DIHYDROCHLORIDE 5 MG PO TABS: 5.0000 mg | ORAL_TABLET | Freq: Every day | ORAL | 1 refills | Status: DC

## 2022-08-27 NOTE — Progress Notes (Signed)
FOLLOW UP Date of Service/Encounter:  08/27/22   Subjective:  [name] (DOB: May 21, 1950) is a 72 y.o. female who [facility] for follow up for cough, allergic rhinitis and GERD.   History obtained from: chart review and patient. Last visit was with Dr. Dellis Anes 03/05/2022 and was on AIT, Flonase, Azelastine, Xyzal, Singulair, Spiriva, Tezspire, Pepcid, Omeprazole, Tums.   Cough: Reports intermittent cough that has improved throughout the years since seeing our office.  It still comes and goes.  No clear triggers.  Off the Spiriva for several weeks, no worsening of cough so she stopped it.  Has an albuterol inhaler that she rarely uses.   Allergies: Reports doing better with AIT.  Still with some post nasal drainage.  Using nose sprays, not sure which ones.  Small swelling at site of injection, nothing bothersome. Taking xyzal daily or BID. Also on Singulair daily.   GERD: Doing well, not much symptoms of heartburn or sour taste in mouth. Taking Pepcid PRN and Omeprazole PRN. She is worried about bone loss so doesn't want to take PPI daily.   Past Medical History: Past Medical History:  Diagnosis Date   Constipation    GERD (gastroesophageal reflux disease)    Hemorrhoids    Migraine headache    Urticaria    Deodorants    Objective:  BP 130/78   Pulse 66   Temp 98.1 F (36.7 C) (Temporal)   Resp 14   Ht 5' 3.25" (1.607 m)   Wt 176 lb (79.8 kg)   SpO2 95%   BMI 30.93 kg/m  Body mass index is 30.93 kg/m. Physical Exam: GEN: alert, well developed HEENT: clear conjunctiva, TM grey and translucent, nose with mild inferior turbinate hypertrophy, pink nasal mucosa, no rhinorrhea, + cobblestoning HEART: regular rate and rhythm, no murmur LUNGS: clear to auscultation bilaterally, no coughing, unlabored respiration SKIN: no rashes or lesions  Assessment:   1. Seasonal and perennial allergic rhinitis   2. Chronic cough    3. Gastroesophageal reflux disease, unspecified whether esophagitis present     Plan/Recommendations:  1. Allergic rhinitis  - Positive SPT 05/2016: grasses, weeds, ragweed, trees, molds, cat, dog, cockroach, dust mite - Continue with allergy shots at the same schedule. Keep Epipen. Started 2018, reached maintenance 11/2017.  Still with persistent post nasal drainage and chronic cough so will continue for now.   - Avoidance measures discussed. - Use nasal saline rinses before nose sprays such as with Neilmed Sinus Rinse.  Use distilled water.   - Use Flonase 1-2 sprays each nostril daily. Aim upward and outward. - Use Azelastine 1-2 sprays each nostril twice daily as needed. Aim upward and outward. - Continue with Xyzal 5mg  1-2 times daily as needed.  - Continue with Singulair (montelukast) 10mg  daily.   2. Chronic Cough - Unclear etiology; likely multifactorial from reflux and post nasal drainage. Previous spirometry x3 without obstruction/reversibility.   - Rescue inhaler: Albuterol 2 puffs every 4-6 hours as needed for respiratory symptoms of cough. - Stop Spiriva since it is not helping and you have been off of it for weeks without worsening cough.    3. Reflux  - Continue with Pepcid (famotidine) 20mg  twice daily as needed.  - Continue with omeprazole daily but only when symptoms are uncontrolled.     - Continue with Tums as needed.    Return in about 6 months (around 02/27/2023).  [name], MD [facility]

## 2022-08-27 NOTE — Patient Instructions (Addendum)
1. Chronic rhinitis (grasses, weeds, ragweed, trees, molds, cat, dog, cockroach, dust mite) - Continue with allergy shots at the same schedule. Keep Epipen. - Avoidance measures discussed. - Use nasal saline rinses before nose sprays such as with Neilmed Sinus Rinse.  Use distilled water.   - Use Flonase 1-2 sprays each nostril daily. Aim upward and outward. - Use Azelastine 1-2 sprays each nostril twice daily as needed. Aim upward and outward. - Continue with Xyzal 5mg  1-2 times daily as needed.  - Continue with Singulair (montelukast) 10mg  daily.   2. Chronic Cough - Rescue inhaler: Albuterol 2 puffs every 4-6 hours as needed for respiratory symptoms of cough. - Stop Spiriva since it is not helping and you have been off of it for weeks without worsening cough.    3. Reflux  - Continue with Pepcid (famotidine) 20mg  twice daily as needed.  - Continue with omeprazole daily but only when symptoms are uncontrolled.     - Continue with Tums as needed.  4. No follow-ups on file.

## 2022-09-12 ENCOUNTER — Ambulatory Visit (INDEPENDENT_AMBULATORY_CARE_PROVIDER_SITE_OTHER): Payer: Medicare Other

## 2022-09-12 DIAGNOSIS — J309 Allergic rhinitis, unspecified: Secondary | ICD-10-CM | POA: Diagnosis not present

## 2022-09-18 DIAGNOSIS — J301 Allergic rhinitis due to pollen: Secondary | ICD-10-CM | POA: Diagnosis not present

## 2022-09-18 NOTE — Progress Notes (Signed)
VIALS EXP 09-18-23

## 2022-09-19 DIAGNOSIS — J3081 Allergic rhinitis due to animal (cat) (dog) hair and dander: Secondary | ICD-10-CM | POA: Diagnosis not present

## 2022-09-22 ENCOUNTER — Other Ambulatory Visit: Payer: Self-pay | Admitting: Allergy & Immunology

## 2022-10-03 ENCOUNTER — Ambulatory Visit (INDEPENDENT_AMBULATORY_CARE_PROVIDER_SITE_OTHER): Payer: Medicare Other

## 2022-10-03 DIAGNOSIS — J309 Allergic rhinitis, unspecified: Secondary | ICD-10-CM | POA: Diagnosis not present

## 2022-10-16 ENCOUNTER — Telehealth: Payer: Self-pay | Admitting: Podiatry

## 2022-10-16 DIAGNOSIS — M7741 Metatarsalgia, right foot: Secondary | ICD-10-CM

## 2022-10-16 NOTE — Telephone Encounter (Signed)
Pts husband called and they are wanting a second pair of orthotics ordered. Aware of cost

## 2022-10-19 NOTE — Telephone Encounter (Signed)
These have been ordered .

## 2022-10-24 ENCOUNTER — Ambulatory Visit (INDEPENDENT_AMBULATORY_CARE_PROVIDER_SITE_OTHER): Payer: Medicare Other

## 2022-10-24 DIAGNOSIS — J309 Allergic rhinitis, unspecified: Secondary | ICD-10-CM

## 2022-11-21 ENCOUNTER — Ambulatory Visit (INDEPENDENT_AMBULATORY_CARE_PROVIDER_SITE_OTHER): Payer: Medicare Other

## 2022-11-21 DIAGNOSIS — J309 Allergic rhinitis, unspecified: Secondary | ICD-10-CM

## 2022-12-12 ENCOUNTER — Ambulatory Visit (INDEPENDENT_AMBULATORY_CARE_PROVIDER_SITE_OTHER): Payer: Medicare Other

## 2022-12-12 DIAGNOSIS — J309 Allergic rhinitis, unspecified: Secondary | ICD-10-CM

## 2022-12-19 DIAGNOSIS — M25561 Pain in right knee: Secondary | ICD-10-CM | POA: Diagnosis not present

## 2023-01-02 ENCOUNTER — Ambulatory Visit (INDEPENDENT_AMBULATORY_CARE_PROVIDER_SITE_OTHER): Payer: Medicare Other

## 2023-01-02 DIAGNOSIS — J309 Allergic rhinitis, unspecified: Secondary | ICD-10-CM

## 2023-01-16 ENCOUNTER — Ambulatory Visit (INDEPENDENT_AMBULATORY_CARE_PROVIDER_SITE_OTHER): Payer: Medicare Other

## 2023-01-16 DIAGNOSIS — J309 Allergic rhinitis, unspecified: Secondary | ICD-10-CM | POA: Diagnosis not present

## 2023-02-01 DIAGNOSIS — K219 Gastro-esophageal reflux disease without esophagitis: Secondary | ICD-10-CM | POA: Diagnosis not present

## 2023-02-01 DIAGNOSIS — J302 Other seasonal allergic rhinitis: Secondary | ICD-10-CM | POA: Diagnosis not present

## 2023-02-01 DIAGNOSIS — E7849 Other hyperlipidemia: Secondary | ICD-10-CM | POA: Diagnosis not present

## 2023-02-01 DIAGNOSIS — Z0001 Encounter for general adult medical examination with abnormal findings: Secondary | ICD-10-CM | POA: Diagnosis not present

## 2023-02-01 DIAGNOSIS — J45909 Unspecified asthma, uncomplicated: Secondary | ICD-10-CM | POA: Diagnosis not present

## 2023-02-01 DIAGNOSIS — R946 Abnormal results of thyroid function studies: Secondary | ICD-10-CM | POA: Diagnosis not present

## 2023-02-01 DIAGNOSIS — R7309 Other abnormal glucose: Secondary | ICD-10-CM | POA: Diagnosis not present

## 2023-02-01 DIAGNOSIS — E782 Mixed hyperlipidemia: Secondary | ICD-10-CM | POA: Diagnosis not present

## 2023-02-06 ENCOUNTER — Ambulatory Visit (INDEPENDENT_AMBULATORY_CARE_PROVIDER_SITE_OTHER): Payer: Medicare Other

## 2023-02-06 DIAGNOSIS — J309 Allergic rhinitis, unspecified: Secondary | ICD-10-CM

## 2023-02-13 ENCOUNTER — Ambulatory Visit (INDEPENDENT_AMBULATORY_CARE_PROVIDER_SITE_OTHER): Payer: Medicare Other

## 2023-02-13 DIAGNOSIS — J309 Allergic rhinitis, unspecified: Secondary | ICD-10-CM

## 2023-02-22 ENCOUNTER — Ambulatory Visit (INDEPENDENT_AMBULATORY_CARE_PROVIDER_SITE_OTHER): Payer: Medicare Other

## 2023-02-22 DIAGNOSIS — J309 Allergic rhinitis, unspecified: Secondary | ICD-10-CM | POA: Diagnosis not present

## 2023-02-27 ENCOUNTER — Ambulatory Visit (INDEPENDENT_AMBULATORY_CARE_PROVIDER_SITE_OTHER): Payer: Medicare Other

## 2023-02-27 DIAGNOSIS — J309 Allergic rhinitis, unspecified: Secondary | ICD-10-CM | POA: Diagnosis not present

## 2023-03-02 DIAGNOSIS — Z1211 Encounter for screening for malignant neoplasm of colon: Secondary | ICD-10-CM | POA: Diagnosis not present

## 2023-03-02 DIAGNOSIS — Z1212 Encounter for screening for malignant neoplasm of rectum: Secondary | ICD-10-CM | POA: Diagnosis not present

## 2023-03-04 ENCOUNTER — Ambulatory Visit: Payer: Medicare Other | Admitting: Internal Medicine

## 2023-03-11 ENCOUNTER — Ambulatory Visit: Payer: Medicare Other | Admitting: Internal Medicine

## 2023-03-11 ENCOUNTER — Other Ambulatory Visit: Payer: Self-pay

## 2023-03-11 ENCOUNTER — Encounter: Payer: Self-pay | Admitting: Internal Medicine

## 2023-03-11 VITALS — BP 128/78 | HR 70 | Temp 97.6°F | Ht 63.25 in | Wt 174.6 lb

## 2023-03-11 DIAGNOSIS — K219 Gastro-esophageal reflux disease without esophagitis: Secondary | ICD-10-CM | POA: Diagnosis not present

## 2023-03-11 DIAGNOSIS — J302 Other seasonal allergic rhinitis: Secondary | ICD-10-CM | POA: Diagnosis not present

## 2023-03-11 DIAGNOSIS — R053 Chronic cough: Secondary | ICD-10-CM

## 2023-03-11 DIAGNOSIS — J3089 Other allergic rhinitis: Secondary | ICD-10-CM | POA: Diagnosis not present

## 2023-03-11 MED ORDER — MONTELUKAST SODIUM 10 MG PO TABS: 10.0000 mg | ORAL_TABLET | Freq: Every day | ORAL | 1 refills | Status: DC

## 2023-03-11 MED ORDER — LEVOCETIRIZINE DIHYDROCHLORIDE 5 MG PO TABS: 5.0000 mg | ORAL_TABLET | Freq: Every day | ORAL | 1 refills | Status: DC

## 2023-03-11 MED ORDER — FLUTICASONE PROPIONATE 50 MCG/ACT NA SUSP: 2.0000 | Freq: Every day | NASAL | 5 refills | Status: DC

## 2023-03-11 MED ORDER — FAMOTIDINE 20 MG PO TABS: 20.0000 mg | ORAL_TABLET | Freq: Two times a day (BID) | ORAL | 5 refills | Status: DC | PRN

## 2023-03-11 MED ORDER — ALBUTEROL SULFATE HFA 108 (90 BASE) MCG/ACT IN AERS: 1.0000 | INHALATION_SPRAY | Freq: Four times a day (QID) | RESPIRATORY_TRACT | 1 refills | Status: DC | PRN

## 2023-03-11 MED ORDER — EPINEPHRINE 0.3 MG/0.3ML IJ SOAJ: 0.3000 mg | Freq: Once | INTRAMUSCULAR | 1 refills | Status: DC | PRN

## 2023-03-11 MED ORDER — AZELASTINE HCL 137 MCG/SPRAY NA SOLN: 1.0000 | Freq: Two times a day (BID) | NASAL | 5 refills | Status: DC

## 2023-03-11 NOTE — Patient Instructions (Addendum)
1. Chronic rhinitis (grasses, weeds, ragweed, trees, molds, cat, dog, cockroach, dust mite) - Continue with allergy shots at the same schedule. Keep Epipen. Started 2018, reached maintenance 11/2017.  Still having post nasal drip/cough so have planned to continue.  - Avoidance measures discussed. - Use nasal saline rinses before nose sprays such as with Neilmed Sinus Rinse.  Use distilled water.   - Use Flonase 2 sprays each nostril daily. Aim upward and outward. - Use Azelastine 1-2 sprays each nostril twice daily as needed. Aim upward and outward. - Continue with Xyzal 5mg  1-2 times daily as needed.  - Continue with Singulair (montelukast) 10mg  daily.   2. Chronic Cough - Previous spirometry without obstruction.  Has not responded to Spiriva/Flovent.  - Rescue inhaler: Albuterol 1-2 puffs every 4-6 hours as needed for respiratory symptoms of cough.  3. Reflux  - Continue with Pepcid (famotidine) 20mg  twice daily as needed.  -Avoid lying down for at least two hours after a meal or after drinking acidic beverages, like soda, or other caffeinated beverages. This can help to prevent stomach contents from flowing back into the esophagus. -Keep your head elevated while you sleep. Using an extra pillow or two can also help to prevent reflux. -Eat smaller and more frequent meals each day instead of a few large meals. This promotes digestion and can aid in preventing heartburn. -Wear loose-fitting clothes to ease pressure on the stomach, which can worsen heartburn and reflux. -Reduce excess weight around the midsection. This can ease pressure on the stomach. Such pressure can force some stomach contents back up the esophagus.

## 2023-03-11 NOTE — Addendum Note (Signed)
Addended byAlesia Morin on: 03/11/2023 12:23 PM   Modules accepted: Level of Service

## 2023-03-11 NOTE — Progress Notes (Addendum)
FOLLOW UP Date of Service/Encounter:  03/11/23   Subjective:  [name] (DOB: 16-Jun-1950) is a 72 y.o. female who [facility] for follow up for allergic rhinitis, reflux, chronic cough.   History obtained from: chart review and patient. At last visit, we discontinued Spiriva since it was not helping with the cough and her spirometry in the past has been normal. Also on Flonase, Azelastine, Xyzal, Singulair, PPI PRN, Pepcid PRN.   Since last visit, reports having trouble with congestion, post nasal drainage and mixed wet/dry cough. Has worsened since Fall started.  She has stopped Flonase/Azelastine and instead tried Nasalcrom but does not think it is helping.  On AIT, no issues.  Has an Epipen.  Still keeps the albuterol for severe coughing spells and thinks it helps somewhat but has not responded to controller inhalers in the past.  Reflux is doing okay, occasionally has some heart burn.  Taking Pepcid PRN.  Has not needed PPI.  Past Medical History: Past Medical History:  Diagnosis Date   Constipation    GERD (gastroesophageal reflux disease)    Hemorrhoids    Migraine headache    Urticaria    Deodorants    Objective:  BP 128/78 (BP Location: Left Arm, Patient Position: Sitting, Cuff Size: Normal)   Pulse 70   Temp 97.6 F (36.4 C)   Ht 5' 3.25" (1.607 m)   Wt 174 lb 9.6 oz (79.2 kg)   SpO2 96%   BMI 30.68 kg/m  Body mass index is 30.68 kg/m. Physical Exam: GEN: alert, well developed HEENT: clear conjunctiva, nose with mild inferior turbinate hypertrophy, pink nasal mucosa, clear rhinorrhea, + cobblestoning HEART: regular rate and rhythm, no murmur LUNGS: clear to auscultation bilaterally, no coughing, unlabored respiration SKIN: no rashes or lesions  Spirometry:  Tracings reviewed. Her effort: Good reproducible efforts. FVC: 2.8L, 103% predicted FEV1: 2.09L, 99% predicted FEV1/FVC ratio: 75% Interpretation:  Spirometry consistent with normal pattern.  Please see scanned spirometry results for details.  Assessment:   1. Gastroesophageal reflux disease, unspecified whether esophagitis present   2. Chronic cough   3. Seasonal and perennial allergic rhinitis     Plan/Recommendations:   1. Chronic rhinitis (grasses, weeds, ragweed, trees, molds, cat, dog, cockroach, dust mite) - Uncontrolled, discussed restarting Flonase/Azelastine and continuing AIT.  - Continue with allergy shots at the same schedule. Keep Epipen. Started 2018, reached maintenance 11/2017.  Still having post nasal drip/cough so have planned to continue.  - Avoidance measures discussed. - Use nasal saline rinses before nose sprays such as with Neilmed Sinus Rinse.  Use distilled water.   - Use Flonase 2 sprays each nostril daily. Aim upward and outward. - Use Azelastine 1-2 sprays each nostril twice daily as needed. Aim upward and outward. - Continue with Xyzal 5mg  1-2 times daily as needed.  - Continue with Singulair (montelukast) 10mg  daily.   2. Chronic Cough - Spirometry today was normal.  Previous spirometry without obstruction.  Has not responded to Spiriva/Flovent. Likely related to postnasal drip.  - Rescue inhaler: Albuterol 1-2 puffs every 4-6 hours as needed for respiratory symptoms of cough.  3. Reflux  - Continue with Pepcid (famotidine) 20mg  twice daily as needed.  -Avoid lying down for at least two hours after a meal or after drinking acidic beverages, like soda, or other caffeinated beverages. This can help to prevent stomach contents from flowing back into the esophagus. -Keep your head elevated while you  sleep. Using an extra pillow or two can also help to prevent reflux. -Eat smaller and more frequent meals each day instead of a few large meals. This promotes digestion and can aid in preventing heartburn. -Wear loose-fitting clothes to ease pressure on the stomach, which can worsen heartburn and  reflux. -Reduce excess weight around the midsection. This can ease pressure on the stomach. Such pressure can force some stomach contents back up the esophagus.      Return in about 6 months (around 09/09/2023).  [name], MD [facility]

## 2023-03-13 DIAGNOSIS — J302 Other seasonal allergic rhinitis: Secondary | ICD-10-CM | POA: Diagnosis not present

## 2023-03-13 NOTE — Progress Notes (Signed)
VIALS EXP 05-03-23 

## 2023-03-14 DIAGNOSIS — J3081 Allergic rhinitis due to animal (cat) (dog) hair and dander: Secondary | ICD-10-CM | POA: Diagnosis not present

## 2023-04-17 ENCOUNTER — Ambulatory Visit (INDEPENDENT_AMBULATORY_CARE_PROVIDER_SITE_OTHER): Payer: Medicare Other

## 2023-04-17 DIAGNOSIS — J309 Allergic rhinitis, unspecified: Secondary | ICD-10-CM

## 2023-04-22 DIAGNOSIS — Z1283 Encounter for screening for malignant neoplasm of skin: Secondary | ICD-10-CM | POA: Diagnosis not present

## 2023-04-22 DIAGNOSIS — X32XXXA Exposure to sunlight, initial encounter: Secondary | ICD-10-CM | POA: Diagnosis not present

## 2023-04-22 DIAGNOSIS — D225 Melanocytic nevi of trunk: Secondary | ICD-10-CM | POA: Diagnosis not present

## 2023-04-22 DIAGNOSIS — L570 Actinic keratosis: Secondary | ICD-10-CM | POA: Diagnosis not present

## 2023-06-03 DIAGNOSIS — X32XXXD Exposure to sunlight, subsequent encounter: Secondary | ICD-10-CM | POA: Diagnosis not present

## 2023-06-03 DIAGNOSIS — L570 Actinic keratosis: Secondary | ICD-10-CM | POA: Diagnosis not present

## 2023-06-03 DIAGNOSIS — L821 Other seborrheic keratosis: Secondary | ICD-10-CM | POA: Diagnosis not present

## 2023-06-05 ENCOUNTER — Ambulatory Visit (INDEPENDENT_AMBULATORY_CARE_PROVIDER_SITE_OTHER): Payer: Medicare Other

## 2023-06-05 DIAGNOSIS — J309 Allergic rhinitis, unspecified: Secondary | ICD-10-CM

## 2023-07-05 DIAGNOSIS — Z0001 Encounter for general adult medical examination with abnormal findings: Secondary | ICD-10-CM | POA: Diagnosis not present

## 2023-07-05 DIAGNOSIS — Z6831 Body mass index (BMI) 31.0-31.9, adult: Secondary | ICD-10-CM | POA: Diagnosis not present

## 2023-07-05 DIAGNOSIS — R946 Abnormal results of thyroid function studies: Secondary | ICD-10-CM | POA: Diagnosis not present

## 2023-07-05 DIAGNOSIS — R32 Unspecified urinary incontinence: Secondary | ICD-10-CM | POA: Diagnosis not present

## 2023-07-05 DIAGNOSIS — E782 Mixed hyperlipidemia: Secondary | ICD-10-CM | POA: Diagnosis not present

## 2023-07-05 DIAGNOSIS — Z1331 Encounter for screening for depression: Secondary | ICD-10-CM | POA: Diagnosis not present

## 2023-07-05 DIAGNOSIS — G43109 Migraine with aura, not intractable, without status migrainosus: Secondary | ICD-10-CM | POA: Diagnosis not present

## 2023-07-05 DIAGNOSIS — R7309 Other abnormal glucose: Secondary | ICD-10-CM | POA: Diagnosis not present

## 2023-07-24 ENCOUNTER — Ambulatory Visit (INDEPENDENT_AMBULATORY_CARE_PROVIDER_SITE_OTHER): Payer: Self-pay

## 2023-07-24 DIAGNOSIS — J309 Allergic rhinitis, unspecified: Secondary | ICD-10-CM | POA: Diagnosis not present

## 2023-08-07 ENCOUNTER — Ambulatory Visit (INDEPENDENT_AMBULATORY_CARE_PROVIDER_SITE_OTHER)

## 2023-08-07 DIAGNOSIS — J309 Allergic rhinitis, unspecified: Secondary | ICD-10-CM

## 2023-09-09 ENCOUNTER — Ambulatory Visit: Payer: Medicare Other | Admitting: Internal Medicine

## 2023-09-09 ENCOUNTER — Other Ambulatory Visit: Payer: Self-pay | Admitting: Internal Medicine

## 2023-09-09 ENCOUNTER — Ambulatory Visit: Admitting: Allergy

## 2023-09-17 DIAGNOSIS — H524 Presbyopia: Secondary | ICD-10-CM | POA: Diagnosis not present

## 2023-09-17 DIAGNOSIS — H5213 Myopia, bilateral: Secondary | ICD-10-CM | POA: Diagnosis not present

## 2023-09-17 DIAGNOSIS — H25813 Combined forms of age-related cataract, bilateral: Secondary | ICD-10-CM | POA: Diagnosis not present

## 2023-09-17 DIAGNOSIS — H43813 Vitreous degeneration, bilateral: Secondary | ICD-10-CM | POA: Diagnosis not present

## 2023-09-17 DIAGNOSIS — H35372 Puckering of macula, left eye: Secondary | ICD-10-CM | POA: Diagnosis not present

## 2023-09-17 NOTE — Patient Instructions (Addendum)
 Allergic rhinitis Continue allergen avoidance measures directed toward grass pollen, weed pollen, tree pollen, ragweed pollen, mold, and cat as listed below Stop allergy injections at this time. If your symptoms worsen, call the clinic for an appointment to retest your environmental allergies Continue montelukast  10 mg once a day to control symptoms of allergic rhinitis Continue an antihistamine once a day if needed for runny nose or itch. Remember to rotate to a different antihistamine about every 3 months. Some examples of over the counter antihistamines include Zyrtec (cetirizine), Xyzal  (levocetirizine), Allegra (fexofenadine), and Claritin (loratidine).  Continue azelastine  2 sprays in each nostril up to twice a day if needed for runny nose Continue Flonase  2 sprays each nostril once a day if needed for stuffy nose.  In the right nostril, point the applicator out toward the right ear. In the left nostril, point the applicator out toward the left ear Consider saline nasal rinses as needed for nasal symptoms. Use this before any medicated nasal sprays for best result  Cough/reactive airway disease Begin levalbuterol  2 puffs once every 4 hours if needed for cough or wheeze  Reflux Continue dietary lifestyle modifications as listed below Continue famotidine  20 mg once a day if needed for reflux symptoms including heartburn or vomiting. If famotidine  not effective, begin omeprazole  once a day for reflux control. Take omeprazole  30-60 minutes before a meal for best results  Call the clinic if this treatment plan is not working well for you.  Follow up in 6 months or sooner if needed.   Lifestyle Changes for Controlling GERD When you have GERD, stomach acid feels as if it's backing up toward your mouth. Whether or not you take medication to control your GERD, your symptoms can often be improved with lifestyle changes.   Raise Your Head Reflux is more likely to strike when you're lying down  flat, because stomach fluid can flow backward more easily. Raising the head of your bed 4-6 inches can help. To do this: Slide blocks or books under the legs at the head of your bed. Or, place a wedge under the mattress. Many foam stores can make a suitable wedge for you. The wedge should run from your waist to the top of your head. Don't just prop your head on several pillows. This increases pressure on your stomach. It can make GERD worse.  Watch Your Eating Habits Certain foods may increase the acid in your stomach or relax the lower esophageal sphincter, making GERD more likely. It's best to avoid the following: Coffee, tea, and carbonated drinks (with and without caffeine) Fatty, fried, or spicy food Mint, chocolate, onions, and tomatoes Any other foods that seem to irritate your stomach or cause you pain  Relieve the Pressure Eat smaller meals, even if you have to eat more often. Don't lie down right after you eat. Wait a few hours for your stomach to empty. Avoid tight belts and tight-fitting clothes. Lose excess weight.  Tobacco and Alcohol Avoid smoking tobacco and drinking alcohol. They can make GERD symptoms worse.  Reducing Pollen Exposure The American Academy of Allergy, Asthma and Immunology suggests the following steps to reduce your exposure to pollen during allergy seasons. Do not hang sheets or clothing out to dry; pollen may collect on these items. Do not mow lawns or spend time around freshly cut grass; mowing stirs up pollen. Keep windows closed at night.  Keep car windows closed while driving. Minimize morning activities outdoors, a time when pollen counts are usually at  their highest. Stay indoors as much as possible when pollen counts or humidity is high and on windy days when pollen tends to remain in the air longer. Use air conditioning when possible.  Many air conditioners have filters that trap the pollen spores. Use a HEPA room air filter to remove pollen  form the indoor air you breathe.  Control of Dog or Cat Allergen Avoidance is the best way to manage a dog or cat allergy. If you have a dog or cat and are allergic to dog or cats, consider removing the dog or cat from the home. If you have a dog or cat but don't want to find it a new home, or if your family wants a pet even though someone in the household is allergic, here are some strategies that may help keep symptoms at bay:  Keep the pet out of your bedroom and restrict it to only a few rooms. Be advised that keeping the dog or cat in only one room will not limit the allergens to that room. Don't pet, hug or kiss the dog or cat; if you do, wash your hands with soap and water. High-efficiency particulate air (HEPA) cleaners run continuously in a bedroom or living room can reduce allergen levels over time. Regular use of a high-efficiency vacuum cleaner or a central vacuum can reduce allergen levels. Giving your dog or cat a bath at least once a week can reduce airborne allergen.  Control of Mold Allergen Mold and fungi can grow on a variety of surfaces provided certain temperature and moisture conditions exist.  Outdoor molds grow on plants, decaying vegetation and soil.  The major outdoor mold, Alternaria and Cladosporium, are found in very high numbers during hot and dry conditions.  Generally, a late Summer - Fall peak is seen for common outdoor fungal spores.  Rain will temporarily lower outdoor mold spore count, but counts rise rapidly when the rainy period ends.  The most important indoor molds are Aspergillus and Penicillium.  Dark, humid and poorly ventilated basements are ideal sites for mold growth.  The next most common sites of mold growth are the bathroom and the kitchen.  Outdoor [employer] Use air conditioning and keep windows closed Avoid exposure to decaying vegetation. Avoid leaf raking. Avoid grain handling. Consider wearing a face mask if working in moldy  areas.  Indoor Mold Control Maintain humidity below 50%. Clean washable surfaces with 5% bleach solution. Remove sources e.g. Contaminated carpets.

## 2023-09-17 NOTE — Progress Notes (Addendum)
 [address] [place] [address] Dept: [phone]  FOLLOW UP NOTE  Patient ID: Amy JULIANNA Rich, female    DOB: Aug 23, 1950  Age: 73 y.o. MRN: [id number] Date of Office Visit: 09/18/2023  Assessment  Chief Complaint: Follow-up (Albuterol  caused heart flutter )  HPI [name] is a 73 year old female who is presents to the clinic for follow-up she was last seen in this clinic on 03/11/2023 by Dr. Tobie for evaluation of allergic rhinitis on allergen immunotherapy, cough, reflux.   At today's visit, she reports that she continues to experience cough over the last 2 weeks which is reported as a mix of dry [name] producing mucus.  She denies shortness of breath or wheeze with activity or rest.  She does report that [place] and she was cleaning with harsh cleaning products about 2 weeks ago.  She reports that she has been using albuterol  about once or twice a day over the last 2 weeks with some relief of symptoms, however, albuterol  caused her to experience a rapid heart rate.  She is interested in trying levalbuterol .  Allergic rhinitis is reported as moderately well-controlled with symptoms including intermittent rhinorrhea, nasal congestion, sneezing, and postnasal drainage.  She continues montelukast  10 mg once a day, Xyzal  5 mg once a day, Flonase  daily, and azelastine  daily.  She began immunotherapy directed toward mold, ragweed, grass pollen, weed pollen, tree pollen, and cat in 2018 and reached maintenance in 11/2017.  Allergic conjunctivitis is reported as moderately well-controlled with occasional itch and occasional watery discharge.  She does report that she went to her eye doctor yesterday for a new eyeglasses prescription.  She does not wear contacts.  Her current medications are listed in the chart.  Drug Allergies:  Allergies  Allergen Reactions   Codeine Nausea Only    Pt has tolerated Codeine if she has taken medication  with food    Physical Exam: BP 134/80   Pulse 65   Temp 98.5 F (36.9 C)   Resp 16   Ht 5' 1.61 (1.565 m)   Wt 177 lb (80.3 kg)   SpO2 98%   BMI 32.78 kg/m    Physical Exam Vitals reviewed.  Constitutional:      Appearance: Normal appearance.  HENT:     Head: Normocephalic and atraumatic.     Right Ear: Tympanic membrane normal.     Left Ear: Tympanic membrane normal.     Nose:     Comments: Bilateral nares slightly erythematous with thin clear nasal drainage noted.  Pharynx slightly erythematous with no exudate.  Ears normal.  Eyes normal.  Eyes:     Conjunctiva/sclera: Conjunctivae normal.    Cardiovascular:     Rate and Rhythm: Normal rate and regular rhythm.     Heart sounds: Normal heart sounds. No murmur heard. Pulmonary:     Effort: Pulmonary effort is normal.     Breath sounds: Normal breath sounds.     Comments: Lungs clear to auscultation.  Patient with cough producing thin clear mucus throughout the visit.  Musculoskeletal:        General: Normal range of motion.     Cervical back: Normal range of motion and neck supple.   Skin:    General: Skin is warm and dry.   Neurological:     Mental Status: She is alert and oriented to person, place, and time.   Psychiatric:  Mood and Affect: Mood normal.        Behavior: Behavior normal.        Thought Content: Thought content normal.        Judgment: Judgment normal.     Diagnostics: FVC 2.66 which is 105% of predicted value, FEV1 2.16 which is 110% of predicted value.  Spirometry indicates normal ventilatory function.  Assessment and Plan: 1. Allergic rhinitis, unspecified seasonality, unspecified trigger   2. Chronic cough   3. Gastroesophageal reflux disease, unspecified whether esophagitis present   4. Mild intermittent reactive airway disease without complication     Meds ordered this encounter  Medications   levalbuterol  (XOPENEX  HFA) 45 MCG/ACT inhaler    Sig: Inhale 2 puffs into  the lungs every 4 (four) hours as needed for wheezing.    Dispense:  1 each    Refill:  3   montelukast  (SINGULAIR ) 10 MG tablet    Sig: Take 1 tablet (10 mg total) by mouth at bedtime.    Dispense:  90 tablet    Refill:  1   levocetirizine (XYZAL ) 5 MG tablet    Sig: Take 1 tablet (5 mg total) by mouth daily as needed for allergies (Can take an extra dose during flare ups.).    Dispense:  180 tablet    Refill:  1   famotidine  (PEPCID ) 20 MG tablet    Sig: Take 1 tablet (20 mg total) by mouth at bedtime.    Dispense:  90 tablet    Refill:  1   omeprazole  (PRILOSEC) 20 MG capsule    Sig: Take 1 capsule (20 mg total) by mouth every morning.    Dispense:  90 capsule    Refill:  1    Courtesy needs ov   fluticasone  (FLONASE ) 50 MCG/ACT nasal spray    Sig: Place 2 sprays into both nostrils every morning.    Dispense:  48 g    Refill:  1   Azelastine  HCl 137 MCG/SPRAY SOLN    Sig: Place 2 sprays into the nose in the morning and at bedtime.    Dispense:  90 mL    Refill:  1    Patient Instructions  Allergic rhinitis Continue allergen avoidance measures directed toward grass pollen, weed pollen, tree pollen, ragweed pollen, mold, and cat as listed below Stop allergy injections at this time. If your symptoms worsen, call the clinic for an appointment to retest your environmental allergies Continue montelukast  10 mg once a day to control symptoms of allergic rhinitis Continue an antihistamine once a day if needed for runny nose or itch. Remember to rotate to a different antihistamine about every 3 months. Some examples of over the counter antihistamines include Zyrtec (cetirizine), Xyzal  (levocetirizine), Allegra (fexofenadine), and Claritin (loratidine).  Continue azelastine  2 sprays in each nostril up to twice a day if needed for runny nose Continue Flonase  2 sprays each nostril once a day if needed for stuffy nose.  In the right nostril, point the applicator out toward the right ear. In  the left nostril, point the applicator out toward the left ear Consider saline nasal rinses as needed for nasal symptoms. Use this before any medicated nasal sprays for best result  Cough/reactive airway disease Begin levalbuterol  2 puffs once every 4 hours if needed for cough or wheeze  Reflux Continue dietary lifestyle modifications as listed below Continue famotidine  20 mg once a day if needed for reflux symptoms including heartburn or vomiting. If famotidine  not effective, begin  omeprazole  once a day for reflux control. Take omeprazole  30-60 minutes before a meal for best results  Call the clinic if this treatment plan is not working well for you.  Follow up in 6 months or sooner if needed.   Return in about 6 months (around 03/19/2024), or if symptoms worsen or fail to improve.    Thank you for the opportunity to care for this patient.  Please do not hesitate to contact me with questions.  Arlean Mutter, FNP [facility] 

## 2023-09-18 ENCOUNTER — Encounter: Payer: Self-pay | Admitting: Family Medicine

## 2023-09-18 ENCOUNTER — Ambulatory Visit: Admitting: Family Medicine

## 2023-09-18 VITALS — BP 134/80 | HR 65 | Temp 98.5°F | Resp 16 | Ht 61.61 in | Wt 177.0 lb

## 2023-09-18 DIAGNOSIS — K219 Gastro-esophageal reflux disease without esophagitis: Secondary | ICD-10-CM

## 2023-09-18 DIAGNOSIS — J4520 Mild intermittent asthma, uncomplicated: Secondary | ICD-10-CM | POA: Diagnosis not present

## 2023-09-18 DIAGNOSIS — R053 Chronic cough: Secondary | ICD-10-CM

## 2023-09-18 DIAGNOSIS — J309 Allergic rhinitis, unspecified: Secondary | ICD-10-CM | POA: Diagnosis not present

## 2023-09-18 MED ORDER — OMEPRAZOLE 20 MG PO CPDR: 20.0000 mg | DELAYED_RELEASE_CAPSULE | Freq: Every morning | ORAL | 1 refills | Status: AC

## 2023-09-18 MED ORDER — FLUTICASONE PROPIONATE 50 MCG/ACT NA SUSP: 2.0000 | Freq: Every morning | NASAL | 1 refills | Status: AC

## 2023-09-18 MED ORDER — FAMOTIDINE 20 MG PO TABS: 20.0000 mg | ORAL_TABLET | Freq: Every evening | ORAL | 1 refills | Status: AC

## 2023-09-18 MED ORDER — LEVOCETIRIZINE DIHYDROCHLORIDE 5 MG PO TABS: 5.0000 mg | ORAL_TABLET | Freq: Every day | ORAL | 1 refills | Status: DC | PRN

## 2023-09-18 MED ORDER — LEVALBUTEROL TARTRATE 45 MCG/ACT IN AERO: 2.0000 | INHALATION_SPRAY | RESPIRATORY_TRACT | 3 refills | Status: DC | PRN

## 2023-09-18 MED ORDER — MONTELUKAST SODIUM 10 MG PO TABS: 10.0000 mg | ORAL_TABLET | Freq: Every evening | ORAL | 1 refills | Status: DC

## 2023-09-18 MED ORDER — AZELASTINE HCL 137 MCG/SPRAY NA SOLN
2.0000 | Freq: Two times a day (BID) | NASAL | 1 refills | Status: AC
Start: 2023-09-18 — End: ?

## 2023-09-27 ENCOUNTER — Telehealth: Payer: Self-pay

## 2023-09-27 NOTE — Telephone Encounter (Signed)
*  AA  Pharmacy Patient Advocate Encounter   Received notification from CoverMyMeds that prior authorization for Xopenex  HFA is required/requested.   Insurance verification completed.   The patient is insured through [facility] ADVANTAGE/RX ADVANCE .   Per test claim: PA required; PA submitted to above mentioned insurance via CoverMyMeds Key/confirmation #/[id number] [facility] Status is pending

## 2023-09-30 NOTE — Telephone Encounter (Signed)
 Pharmacy Patient Advocate Encounter  Received notification from [employer]RX ADVANCE that Prior Authorization for Xopenex  HFA has been DENIED.  Full denial letter will be uploaded to the media tab. See denial reason below.   Your request was denied because it is being used for an indication which is NOT approved or medically-accepted: Chronic cough

## 2023-10-01 NOTE — Telephone Encounter (Signed)
 I have amended her office note to cough with reactive airway disease. Can you please resubmit Xopenex  Thank you

## 2023-10-01 NOTE — Telephone Encounter (Signed)
 I called the patient and she was informed by her insurance that the prior auth was denied. Patient stated that the albuterol  caused her to have increased heart rate. She would like the xopenex  and due to how the albuterol  made her feel she is not able to take it as a rescue inhaler.

## 2023-10-01 NOTE — Telephone Encounter (Signed)
 Can you please let her know that her insurance denied the rescue inhaler xopenex . Can you please ask her if xopenex  had been working for her? If she was not able to get xopenex  at all, was albuterol  working for her? We can approach this in a different way if needed. Thank you

## 2023-10-02 ENCOUNTER — Telehealth: Payer: Self-pay

## 2023-10-02 NOTE — Telephone Encounter (Signed)
*  AA  Appeal has been submitted. Will advise when response is received or follow up in 1 week. Please be advised that most companies may take 30 days to make a decision.  CMM Key:  [id number]  Submitted with additional information I have amended her office note to cough with reactive airway disease. Can you please resubmit Xopenex  Thank you Patient stated that the albuterol  caused her to have increased heart rate. She would like the xopenex  and due to how the albuterol  made her feel she is not able to take it as a rescue inhaler.

## 2023-10-02 NOTE — Telephone Encounter (Signed)
Appeal has been submitted. Will advise when response is received or follow up in 1 week. Please be advised that most companies may take 30 days to make a decision.

## 2023-10-10 NOTE — Telephone Encounter (Signed)
 Patient has been informed of the appeal wait time for the xopenex  inhaler and has no further questions or concerns.

## 2023-10-14 NOTE — Telephone Encounter (Signed)
 Pharmacy Patient Advocate Encounter  Received notification from [facility] ADVANTAGE/RX ADVANCE that Prior Authorization for Xopenex  HFA 45 mcg/actuation aerosol inhaler has been APPROVED from 10-04-2023 to 04-08-2024

## 2023-11-01 NOTE — Telephone Encounter (Signed)
 I called the pharmacy and the brand of xopenex  is covered, but her copay is $75. Patient has been notified and plans to call the pharmacy to have them order it for her.

## 2023-12-10 DIAGNOSIS — L570 Actinic keratosis: Secondary | ICD-10-CM | POA: Diagnosis not present

## 2023-12-10 DIAGNOSIS — L821 Other seborrheic keratosis: Secondary | ICD-10-CM | POA: Diagnosis not present

## 2023-12-10 DIAGNOSIS — X32XXXD Exposure to sunlight, subsequent encounter: Secondary | ICD-10-CM | POA: Diagnosis not present

## 2024-03-12 DIAGNOSIS — M8589 Other specified disorders of bone density and structure, multiple sites: Secondary | ICD-10-CM | POA: Diagnosis not present

## 2024-03-12 DIAGNOSIS — Z1231 Encounter for screening mammogram for malignant neoplasm of breast: Secondary | ICD-10-CM | POA: Diagnosis not present

## 2024-03-27 ENCOUNTER — Encounter: Payer: Self-pay | Admitting: Allergy & Immunology

## 2024-03-27 ENCOUNTER — Ambulatory Visit: Admitting: Allergy & Immunology

## 2024-03-27 ENCOUNTER — Other Ambulatory Visit: Payer: Self-pay

## 2024-03-27 VITALS — BP 130/82 | HR 66

## 2024-03-27 DIAGNOSIS — H9193 Unspecified hearing loss, bilateral: Secondary | ICD-10-CM | POA: Diagnosis not present

## 2024-03-27 DIAGNOSIS — R0981 Nasal congestion: Secondary | ICD-10-CM | POA: Diagnosis not present

## 2024-03-27 DIAGNOSIS — J4520 Mild intermittent asthma, uncomplicated: Secondary | ICD-10-CM

## 2024-03-27 MED ORDER — LEVOCETIRIZINE DIHYDROCHLORIDE 5 MG PO TABS: 5.0000 mg | ORAL_TABLET | Freq: Every day | ORAL | 1 refills | Status: AC | PRN

## 2024-03-27 MED ORDER — SPIRIVA RESPIMAT 1.25 MCG/ACT IN AERS: 2.0000 | INHALATION_SPRAY | Freq: Every day | RESPIRATORY_TRACT | 5 refills | Status: AC

## 2024-03-27 MED ORDER — LEVALBUTEROL TARTRATE 45 MCG/ACT IN AERO: 2.0000 | INHALATION_SPRAY | RESPIRATORY_TRACT | 3 refills | Status: AC | PRN

## 2024-03-27 MED ORDER — MONTELUKAST SODIUM 10 MG PO TABS: 10.0000 mg | ORAL_TABLET | Freq: Every evening | ORAL | 1 refills | Status: AC

## 2024-03-27 NOTE — Patient Instructions (Addendum)
 1. Chronic rhinitis (grasses, weeds, ragweed, trees, molds, cat, dog, cockroach, dust mite) - We are sending you to see Dr. Karis again. - Referral placed. - Continue with Xyzal  5mg  1-2 times daily as needed.  - Continue with Singulair  (montelukast ) 10mg  daily.  2. Cough - likely reactive airway dysfunction syndrome - Lung function looks today.  - We are going to add on Spiriva  to see if that can help with your symptoms. - Daily controller medication(s): Singulair  10mg  daily and Spiriva  1.25mcg two puffs once daily - Prior to physical activity: Xopenex  2 puffs 10-15 minutes before physical activity. - Rescue medications: Xopenex  4 puffs every 4-6 hours as needed - Asthma control goals:  * Full participation in all desired activities (may need albuterol  before activity) * Albuterol  use two time or less a week on average (not counting use with activity) * Cough interfering with sleep two time or less a month * Oral steroids no more than once a year * No hospitalizations  3. Reflux  - Continue with Pepcid  (famotidine ) 20mg  twice daily as needed.  - Continue with omeprazole  daily during particularly bad times.    - Continue with Tums as needed.  4. Return in about 3 months (around 06/25/2024). You can have the follow up appointment with Dr. Iva or a Nurse Practicioner (our Nurse Practitioners are excellent and always have Physician oversight!).    Please inform us  of any Emergency Department visits, hospitalizations, or changes in symptoms. Call us  before going to the ED for breathing or allergy symptoms since we might be able to fit you in for a sick visit. Feel free to contact us  anytime with any questions, problems, or concerns.  It was a pleasure to see you again today!  Websites that have reliable patient information: 1. American Academy of Asthma, Allergy, and Immunology: www.aaaai.org 2. Food Allergy Research and Education (FARE): foodallergy.org 3. Mothers of Asthmatics:  http://www.asthmacommunitynetwork.org 4. American College of Allergy, Asthma, and Immunology: www.acaai.org      Like us  on [employer] and Instagram for our latest updates!      A healthy democracy works best when [employer] participate! Make sure you are registered to vote! If you have moved or changed any of your contact information, you will need to get this updated before voting! Scan the QR codes below to learn more!

## 2024-03-27 NOTE — Progress Notes (Unsigned)
 "  FOLLOW UP  Date of Service/Encounter:  03/27/2024   Assessment:   Moderate persistent reactive airways dysfunction syndrome   Seasonal and perennial allergic rhinitis (grasses, weeds, trees, most mold mixes, and cat) - on allergen immunotherapy with maintenance reached August 2019   Gastroesophageal reflux disease  Plan/Recommendations:   Patient Instructions  1. Chronic rhinitis (grasses, weeds, ragweed, trees, molds, cat, dog, cockroach, dust mite) - We are sending you to see Dr. Karis again. - Referral placed. - Continue with Xyzal  5mg  1-2 times daily as needed.  - Continue with Singulair  (montelukast ) 10mg  daily.  2. Cough - likely reactive airway dysfunction syndrome - Lung function looks today.  - We are going to add on Spiriva  to see if that can help with your symptoms. - Daily controller medication(s): Singulair  10mg  daily and Spiriva  1.25mcg two puffs once daily - Prior to physical activity: Xopenex  2 puffs 10-15 minutes before physical activity. - Rescue medications: Xopenex  4 puffs every 4-6 hours as needed - Asthma control goals:  * Full participation in all desired activities (may need albuterol  before activity) * Albuterol  use two time or less a week on average (not counting use with activity) * Cough interfering with sleep two time or less a month * Oral steroids no more than once a year * No hospitalizations  3. Reflux  - Continue with Pepcid  (famotidine ) 20mg  twice daily as needed.  - Continue with omeprazole  daily during particularly bad times.    - Continue with Tums as needed.  4. Return in about 3 months (around 06/25/2024). You can have the follow up appointment with Dr. Iva or a Nurse Practicioner (our Nurse Practitioners are excellent and always have Physician oversight!).    Please inform us  of any Emergency Department visits, hospitalizations, or changes in symptoms. Call us  before going to the ED for breathing or allergy symptoms since we  might be able to fit you in for a sick visit. Feel free to contact us  anytime with any questions, problems, or concerns.  It was a pleasure to see you again today!  Websites that have reliable patient information: 1. American Academy of Asthma, Allergy, and Immunology: www.aaaai.org 2. Food Allergy Research and Education (FARE): foodallergy.org 3. Mothers of Asthmatics: http://www.asthmacommunitynetwork.org 4. American College of Allergy, Asthma, and Immunology: www.acaai.org      Like us  on [employer] and Instagram for our latest updates!      A healthy democracy works best when [employer] participate! Make sure you are registered to vote! If you have moved or changed any of your contact information, you will need to get this updated before voting! Scan the QR codes below to learn more!              Subjective:   [name] is a 73 y.o. female presenting today for follow up of  Chief Complaint  Patient presents with   Moderate persistent reactive airways dysfunction syndrome    Seasonal and perennial allergic rhinitis   Gastroesophageal reflux disease   Follow-up   Cough    [name] has a history of the following: Patient Active Problem List   Diagnosis Date Noted   Allergic rhinitis 09/18/2023   Chronic cough 09/18/2023   Seasonal and perennial allergic rhinitis 03/12/2017   Reactive airways dysfunction syndrome 03/12/2017   Gastroesophageal reflux disease 03/12/2017   Closed fracture of right tibial plateau 08/20/2016    History obtained from: chart review and patient.  Discussed the use of AI  scribe software for clinical note transcription with the patient and/or guardian, who gave verbal consent to proceed.  [name] is a 73 y.o. female presenting for a follow up visit.  She was last seen in June 2025 by A former loss adjuster, chartered.  At that time, she was continued onnne Ambs, leave albuterol  as needed.  Reflux was controlled with  famotidine  once a day with omeprazole  added during flares.  For her allergic rhinitis, she continue with montelukast .  She decided to stop her allergy shots.  She also remained on the Flonase .  Since last visit,  Asthma/Respiratory Symptom History: ***  Allergic Rhinitis Symptom History: She remains on her alternating antihistamines. She does have some nose burning when she uses it consistently. She will take one week vacations from the antihistamines and then restart it for several weeks. She is using a nose spray nearly every day. She has been having a lot of irritating symptoms including nasal congestion. This is mostly clear.   Food Allergy Symptom History: ***  Skin Symptom History: ***  GERD Symptom History: ***  Infection Symptom History: ***  Otherwise, there have been no changes to her past medical history, surgical history, family history, or social history.    Review of systems otherwise negative other than that mentioned in the HPI.    Objective:   Blood pressure 130/82, pulse 66, SpO2 97%. There is no height or weight on file to calculate BMI.    Physical Exam   Diagnostic studies:    Spirometry: results normal (FEV1: 1.87/96%, FVC: 2.69/108%, FEV1/FVC: 70%).    Spirometry consistent with normal pattern. {Blank single:19197::Albuterol /Atrovent nebulizer,Xopenex /Atrovent nebulizer,Albuterol  nebulizer,Albuterol  four puffs via MDI,Xopenex  four puffs via MDI} treatment given in clinic with {Blank single:19197::significant improvement in FEV1 per ATS criteria,significant improvement in FVC per ATS criteria,significant improvement in FEV1 and FVC per ATS criteria,improvement in FEV1, but not significant per ATS criteria,improvement in FVC, but not significant per ATS criteria,improvement in FEV1 and FVC, but not significant per ATS criteria,no improvement}.  Allergy Studies: {Blank single:19197::none,deferred due to recent antihistamine  use,deferred due to insurance stipulations that require a separate visit for testing,labs sent instead, }    {Blank single:19197::Allergy testing results were read and interpreted by myself, documented by clinical staff., }      Amy Shaggy, MD  [facility]        "

## 2024-03-30 ENCOUNTER — Encounter: Payer: Self-pay | Admitting: Allergy & Immunology

## 2024-06-26 ENCOUNTER — Ambulatory Visit: Admitting: Family Medicine

## 2024-07-30 ENCOUNTER — Institutional Professional Consult (permissible substitution) (INDEPENDENT_AMBULATORY_CARE_PROVIDER_SITE_OTHER): Admitting: Otolaryngology
